# Patient Record
Sex: Male | Born: 1970 | Race: Black or African American | Hispanic: No | Marital: Single | Smoking: Current every day smoker
Health system: Southern US, Community
[De-identification: ages and names within clinical notes are randomized; demographics above are authoritative.]

---

## 2011-04-19 ENCOUNTER — Emergency Department (HOSPITAL_COMMUNITY)
Admission: EM | Admit: 2011-04-19 | Discharge: 2011-04-19 | Disposition: A | Discharge status: Home / Self Care | Payer: Self-pay | Attending: Emergency Medicine | Admitting: Emergency Medicine

## 2011-04-19 ENCOUNTER — Encounter (HOSPITAL_COMMUNITY): Payer: Self-pay | Admitting: Emergency Medicine

## 2011-04-19 DIAGNOSIS — F17200 Nicotine dependence, unspecified, uncomplicated: Secondary | ICD-10-CM | POA: Insufficient documentation

## 2011-04-19 DIAGNOSIS — Z203 Contact with and (suspected) exposure to rabies: Secondary | ICD-10-CM | POA: Insufficient documentation

## 2011-04-19 DIAGNOSIS — S61409A Unspecified open wound of unspecified hand, initial encounter: Secondary | ICD-10-CM | POA: Insufficient documentation

## 2011-04-19 DIAGNOSIS — Y92410 Unspecified street and highway as the place of occurrence of the external cause: Secondary | ICD-10-CM | POA: Insufficient documentation

## 2011-04-19 DIAGNOSIS — W540XXA Bitten by dog, initial encounter: Secondary | ICD-10-CM

## 2011-04-19 DIAGNOSIS — Z23 Encounter for immunization: Secondary | ICD-10-CM

## 2011-04-19 MED ORDER — TETANUS-DIPHTH-ACELL PERTUSSIS 5-2.5-18.5 LF-MCG/0.5 IM SUSP
0.5000 mL | Freq: Once | INTRAMUSCULAR | Status: AC
  Administered 2011-04-19: 0.5 mL via INTRAMUSCULAR
  Filled 2011-04-19: qty 0.5

## 2011-04-19 MED ORDER — OXYCODONE-ACETAMINOPHEN 5-325 MG PO TABS
2.0000 | ORAL_TABLET | Freq: Once | ORAL | Status: AC
  Administered 2011-04-19: 2 via ORAL
  Filled 2011-04-19: qty 2

## 2011-04-19 MED ORDER — OXYCODONE-ACETAMINOPHEN 5-325 MG PO TABS: 2.0000 | ORAL_TABLET | Freq: Four times a day (QID) | ORAL | Status: AC | PRN

## 2011-04-19 MED ORDER — AMOXICILLIN-POT CLAVULANATE 875-125 MG PO TABS: 1.0000 | ORAL_TABLET | ORAL | Status: DC

## 2011-04-19 MED ORDER — AMOXICILLIN-POT CLAVULANATE 875-125 MG PO TABS
1.0000 | ORAL_TABLET | ORAL | Status: AC
  Administered 2011-04-19: 1 via ORAL
  Filled 2011-04-19: qty 1

## 2011-04-19 MED ORDER — RABIES IMMUNE GLOBULIN 150 UNIT/ML IM INJ
20.0000 [IU]/kg | INJECTION | INTRAMUSCULAR | Status: AC
  Administered 2011-04-19: 1650 [IU]
  Filled 2011-04-19: qty 11

## 2011-04-19 MED ORDER — RABIES VACCINE, PCEC IM SUSR
1.0000 mL | INTRAMUSCULAR | Status: AC
  Administered 2011-04-19: 1 mL via INTRAMUSCULAR
  Filled 2011-04-19: qty 1

## 2011-04-19 NOTE — ED Notes (Signed)
Pt c/o animal bite of R hand. Pt has 3 puncture wounds around his thumb. Pt states it was a baby pitbull. Encounter with dog occurred around 4pm today. Pt pain 8/10.

## 2011-04-19 NOTE — ED Notes (Signed)
Pt states he was walking down road on [facility] and a pit bull came up and bit his R hand.  3 puncture wounds noted to R hand. Bleeding controlled.  Unknown dog.

## 2011-04-19 NOTE — ED Notes (Signed)
Called flow Production designer, theatre/television[employer]film. Spoke with Patty concerning f/u appts. She states to inform pt that they will contact him within the next couple days. Pt informed.

## 2011-04-20 NOTE — ED Provider Notes (Signed)
History     CSN: [id number]  Arrival date & time 04/19/11  1652   First MD Initiated Contact with Patient 04/19/11 1855      Chief Complaint  Patient presents with  . Animal Bite    (Consider location/radiation/quality/duration/timing/severity/associated sxs/prior treatment) HPI Patient is a 41 yo male who was walking down the street today when an unknown dog ran up and bit his right hand.  He sustained 3 puncture wounds.  Patient does not know when his last tetanus shot was.  He denies significant pain at this time.  The dog was unknown and unable to be found.  There are no other associated or modifying factors.  History reviewed. No pertinent past medical history.  History reviewed. No pertinent past surgical history.  History reviewed. No pertinent family history.  History  Substance Use Topics  . Smoking status: Current Everyday Smoker  . Smokeless tobacco: Not on file  . Alcohol Use: Yes      Review of Systems  All other systems reviewed and are negative.    Allergies  Review of patient's allergies indicates no known allergies.  Home Medications   Current Outpatient Rx  Name Route Sig Dispense Refill  . AMOXICILLIN-POT CLAVULANATE 875-125 MG PO TABS Oral Take 1 tablet by mouth to [facility]. 20 tablet 0  . OXYCODONE-ACETAMINOPHEN 5-325 MG PO TABS Oral Take 2 tablets by mouth every 6 (six) hours as needed for pain. 10 tablet 0    BP 132/82  Pulse 92  Temp(Src) 98.1 F (36.7 C) (Oral)  Resp 20  Ht 6\' 2"  (1.88 m)  Wt 181 lb 11.2 oz (82.419 kg)  BMI 23.33 kg/m2  SpO2 97%  Physical Exam  Nursing note and vitals reviewed. GEN: Well-developed, well-nourished male in no distress HEENT: Atraumatic, normocephalic. Oropharynx clear without erythema EYES: PERRLA BL, no scleral icterus. NECK: Trachea midline, no meningismus Neuro: cranial nerves 2-12 intact, no abnormalities of strength or sensation, A and O x 3 MSK: Patient moves all 4 extremities  symmetrically, no deformity, edema, or injury noted Psych: no abnormality of mood Skin: Patient has 3 small puncture wounds over his right thumb and thenar eminence   ED Course  Procedures (including critical care time)  Labs Reviewed - No data to display No results found.   1. Dog bite   2. Need for prophylactic vaccination and inoculation against rabies       MDM  Patient was evaluated and was given a TDAP as well as rabies vaccine and immunoglobulin.  He was also given a dose of augmentin and was discharged with prescription for this.  Patient did have a percocet for pain from the vaccination and treatment process.  He was discharged with 10 pills of this.  Patient was told flow manager will be contacting him by phone for further vaccinations.        Cyndra Numbers, MD 04/20/11 1451

## 2011-04-20 NOTE — ED Notes (Signed)
Patient informed to call Urgent Care For Follow up appoinmentt time.Copy of schedule faxed to urgent care and pharmacy.

## 2011-04-22 ENCOUNTER — Telehealth (HOSPITAL_COMMUNITY): Payer: Self-pay | Admitting: *Deleted

## 2011-04-22 ENCOUNTER — Emergency Department (INDEPENDENT_AMBULATORY_CARE_PROVIDER_SITE_OTHER)
Admission: EM | Admit: 2011-04-22 | Discharge: 2011-04-22 | Disposition: A | Discharge status: Home / Self Care | Payer: Self-pay | Source: Home / Self Care

## 2011-04-22 ENCOUNTER — Encounter (HOSPITAL_COMMUNITY): Payer: Self-pay | Admitting: *Deleted

## 2011-04-22 DIAGNOSIS — Z23 Encounter for immunization: Secondary | ICD-10-CM

## 2011-04-22 MED ORDER — RABIES VACCINE, PCEC IM SUSR
INTRAMUSCULAR | Status: AC
  Filled 2011-04-22: qty 1

## 2011-04-22 MED ORDER — RABIES VACCINE, PCEC IM SUSR
1.0000 mL | Freq: Once | INTRAMUSCULAR | Status: AC
  Administered 2011-04-22: 1 mL via INTRAMUSCULAR

## 2011-04-22 NOTE — ED Notes (Signed)
Pt. Here for 2nd rabies vaccine for dog bite ( pitbull) to R thumb.  Pt. states he has not filled the Rx. of Augmentin because it was $50.00 and he could not afford. He filled the Percocet.

## 2011-04-22 NOTE — Discharge Instructions (Signed)
Call if any problems.  Return 2/17 for next vaccine.

## 2011-04-22 NOTE — ED Notes (Addendum)
Pt. instructed to get Augmentin ASAP and start taking it.  Pt. told that if the joint gets infected, he may have to be admitted to the hospital for IV antibiotics or need surgery. Pt. instructed to wash wounds BID with soap and water and apply antibiotic ointment. Pt. voiced understanding.

## 2011-04-22 NOTE — ED Notes (Signed)
Pt. called to schedule his rabies shot. States he did not get a schedule. He said he was seen 2/10. I told him he needs to come today and 2/17 and 2/24. Because the last 2 days fall on the weekend, I told pt. He can come anytime between 0900 and 1900. He needs to come today before 1700. I called the pharmacy and she said she did not receive information on the pt. She transferred to Von, I told him we would need 3 rabies vaccines for the pt. Tonya flow manager documented that she sent information to [place] and the pharmacy on 2/11. I checked the Rabies book and the fax machine and no information received. Vassie Moselle 04/22/2011

## 2011-04-23 ENCOUNTER — Telehealth (HOSPITAL_COMMUNITY): Payer: Self-pay | Admitting: *Deleted

## 2011-04-26 ENCOUNTER — Emergency Department (HOSPITAL_COMMUNITY)
Admission: EM | Admit: 2011-04-26 | Discharge: 2011-04-26 | Disposition: A | Discharge status: Home / Self Care | Payer: Self-pay | Source: Home / Self Care

## 2011-04-26 ENCOUNTER — Encounter (HOSPITAL_COMMUNITY): Payer: Self-pay | Admitting: *Deleted

## 2011-04-26 MED ORDER — RABIES VACCINE, PCEC IM SUSR
INTRAMUSCULAR | Status: AC
  Filled 2011-04-26: qty 1

## 2011-04-26 MED ORDER — RABIES VACCINE, PCEC IM SUSR
1.0000 mL | Freq: Once | INTRAMUSCULAR | Status: AC
  Administered 2011-04-26: 1 mL via INTRAMUSCULAR

## 2011-04-26 NOTE — Discharge Instructions (Signed)
Return on 2./24 for final rabies vaccination

## 2011-04-26 NOTE — ED Notes (Signed)
Pt here for 3rd rabies injection 

## 2011-04-26 NOTE — ED Notes (Signed)
Right hand bite site healing well no signs of infection   Pt did not get antibiotic or pain med prescription filled due to cost

## 2011-05-03 ENCOUNTER — Encounter (HOSPITAL_COMMUNITY): Payer: Self-pay | Admitting: *Deleted

## 2011-05-03 ENCOUNTER — Emergency Department (HOSPITAL_COMMUNITY)
Admission: EM | Admit: 2011-05-03 | Discharge: 2011-05-03 | Disposition: A | Discharge status: Home / Self Care | Payer: Self-pay | Source: Home / Self Care

## 2011-05-03 MED ORDER — RABIES VACCINE, PCEC IM SUSR
1.0000 mL | Freq: Once | INTRAMUSCULAR | Status: AC
  Administered 2011-05-03: 1 mL via INTRAMUSCULAR

## 2011-05-03 MED ORDER — RABIES VACCINE, PCEC IM SUSR
INTRAMUSCULAR | Status: AC
  Filled 2011-05-03: qty 1

## 2011-05-03 NOTE — ED Notes (Signed)
Patient here for final rabies injection. 

## 2011-10-07 ENCOUNTER — Emergency Department (HOSPITAL_COMMUNITY)
Admission: EM | Admit: 2011-10-07 | Discharge: 2011-10-08 | Disposition: A | Discharge status: Home / Self Care | Payer: No Typology Code available for payment source | Attending: Emergency Medicine | Admitting: Emergency Medicine

## 2011-10-07 ENCOUNTER — Encounter (HOSPITAL_COMMUNITY): Payer: Self-pay | Admitting: Emergency Medicine

## 2011-10-07 ENCOUNTER — Emergency Department (HOSPITAL_COMMUNITY): Payer: No Typology Code available for payment source

## 2011-10-07 DIAGNOSIS — S335XXA Sprain of ligaments of lumbar spine, initial encounter: Secondary | ICD-10-CM | POA: Insufficient documentation

## 2011-10-07 DIAGNOSIS — Y998 Other external cause status: Secondary | ICD-10-CM | POA: Insufficient documentation

## 2011-10-07 DIAGNOSIS — T148XXA Other injury of unspecified body region, initial encounter: Secondary | ICD-10-CM

## 2011-10-07 DIAGNOSIS — Y93I9 Activity, other involving external motion: Secondary | ICD-10-CM | POA: Insufficient documentation

## 2011-10-07 DIAGNOSIS — F17200 Nicotine dependence, unspecified, uncomplicated: Secondary | ICD-10-CM | POA: Insufficient documentation
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
## 2011-10-08 MED ORDER — TRAMADOL HCL 50 MG PO TABS: 50.0000 mg | ORAL_TABLET | Freq: Four times a day (QID) | ORAL | Status: AC | PRN

## 2011-10-08 NOTE — ED Notes (Signed)
Pt states understanding of discharge instructions 

## 2011-10-08 NOTE — ED Provider Notes (Signed)
Medical screening examination/treatment/procedure(s) were performed by non-physician practitioner and as supervising physician I was immediately available for consultation/collaboration.    Vida Roller, MD 10/08/11 [phone]

## 2014-04-18 ENCOUNTER — Other Ambulatory Visit: Payer: Self-pay | Admitting: *Deleted

## 2017-01-07 ENCOUNTER — Emergency Department (HOSPITAL_COMMUNITY)
Admission: EM | Admit: 2017-01-07 | Discharge: 2017-01-07 | Disposition: A | Discharge status: Home / Self Care | Payer: Self-pay | Attending: Emergency Medicine | Admitting: Emergency Medicine

## 2017-01-07 ENCOUNTER — Emergency Department (HOSPITAL_COMMUNITY): Payer: Self-pay

## 2017-01-07 ENCOUNTER — Encounter (HOSPITAL_COMMUNITY): Payer: Self-pay

## 2017-01-07 DIAGNOSIS — B349 Viral infection, unspecified: Secondary | ICD-10-CM | POA: Insufficient documentation

## 2017-01-07 DIAGNOSIS — J069 Acute upper respiratory infection, unspecified: Secondary | ICD-10-CM | POA: Insufficient documentation

## 2017-01-07 DIAGNOSIS — M545 Low back pain, unspecified: Secondary | ICD-10-CM

## 2017-01-07 DIAGNOSIS — F17210 Nicotine dependence, cigarettes, uncomplicated: Secondary | ICD-10-CM | POA: Insufficient documentation

## 2017-01-07 MED ORDER — ALBUTEROL SULFATE HFA 108 (90 BASE) MCG/ACT IN AERS
2.0000 | INHALATION_SPRAY | RESPIRATORY_TRACT | Status: DC | PRN
  Administered 2017-01-07: 2 via RESPIRATORY_TRACT
  Filled 2017-01-07: qty 6.7

## 2017-01-07 MED ORDER — TRAMADOL HCL 50 MG PO TABS: 50.0000 mg | ORAL_TABLET | Freq: Four times a day (QID) | ORAL | 0 refills | Status: DC | PRN

## 2017-01-07 MED ORDER — PREDNISONE 20 MG PO TABS: ORAL_TABLET | ORAL | 0 refills | Status: DC

## 2017-01-07 MED ORDER — ALBUTEROL SULFATE (2.5 MG/3ML) 0.083% IN NEBU
5.0000 mg | INHALATION_SOLUTION | Freq: Once | RESPIRATORY_TRACT | Status: AC
  Administered 2017-01-07: 5 mg via RESPIRATORY_TRACT
  Filled 2017-01-07: qty 6

## 2017-01-07 NOTE — ED Provider Notes (Signed)
[facility] Provider Note   CSN: [id number] Arrival date & time: 01/07/17  1958     History   Chief Complaint Chief Complaint  Patient presents with  . Cough  . Nasal Congestion    HPI [name] is a 46 y.o. male.  Patient is a 46 year old male with no significant past medical history who presents with cough and congestion as well as back pain.  He reports a one-week history of runny nose nasal congestion and cough.  He is also had some shortness of breath on exertion but denies any current shortness of breath.  No chest pain.  He states with the coughing he feels like he has injured his back.  He complains of a 2-day history of pain in his low back.  It goes across his back.  He denies any falls or trauma.  No radiation down his legs.  No fevers.  No weakness in his legs.  No numbness to his lower extremities.  It is worse when he is ambulating.  No loss of bowel or bladder control.  He has been using over-the-counter cold medicines without improvement in his symptoms.  He is not taking any medicines for his back.  He denies any prior history of back issues.      History reviewed. No pertinent past medical history.  There are no active problems to display for this patient.   History reviewed. No pertinent surgical history.     Home Medications    Prior to Admission medications   Medication Sig Start Date End Date Taking? Authorizing Provider  aspirin-sod bicarb-citric acid (ALKA-SELTZER) 325 MG TBEF tablet Take 325 mg by mouth every 6 (six) hours as needed (congestion).   Yes [provider]  dextromethorphan-guaiFENesin (MUCINEX DM) 30-600 MG 12hr tablet Take 1 tablet by mouth 2 (two) times daily as needed for cough.   Yes [provider]  predniSONE (DELTASONE) 20 MG tablet 3 tabs po day one, then 2 po daily x 4 days 01/07/17   Rolan Bucco, MD  traMADol (ULTRAM) 50 MG tablet Take 1 tablet (50 mg total) by mouth  every 6 (six) hours as needed. 01/07/17   Rolan Bucco, MD    Family History History reviewed. No pertinent family history.  Social History Social History  Substance Use Topics  . Smoking status: Current Every Day Smoker    Packs/day: 0.25    Types: Cigarettes  . Smokeless tobacco: Not on file  . Alcohol use 0.0 oz/week     Comment: 2 beers daily     Allergies   Iodine   Review of Systems Review of Systems  Constitutional: Positive for fatigue. Negative for chills, diaphoresis and fever.  HENT: Positive for congestion, postnasal drip and rhinorrhea. Negative for sneezing.   Eyes: Negative.   Respiratory: Positive for cough and shortness of breath. Negative for chest tightness.   Cardiovascular: Negative for chest pain and leg swelling.  Gastrointestinal: Negative for abdominal pain, blood in stool, diarrhea, nausea and vomiting.  Genitourinary: Negative for difficulty urinating, flank pain, frequency and hematuria.  Musculoskeletal: Positive for back pain. Negative for arthralgias.  Skin: Negative for rash.  Neurological: Negative for dizziness, speech difficulty, weakness, numbness and headaches.     Physical Exam Updated Vital Signs BP 117/74 (BP Location: Left Arm)   Pulse 85   Temp 98.8 F (37.1 C) (Oral)   Resp 18   Ht 6\' 3"  (1.905 m)   Wt 81.6 kg (180 lb)  SpO2 98%   BMI 22.50 kg/m   Physical Exam  Constitutional: He is oriented to person, place, and time. He appears well-developed and well-nourished.  HENT:  Head: Normocephalic and atraumatic.  Eyes: Pupils are equal, round, and reactive to light.  Neck: Normal range of motion. Neck supple.  Cardiovascular: Normal rate, regular rhythm and normal heart sounds.   Pulmonary/Chest: Effort normal. No respiratory distress. He has wheezes (Mild expiratory wheezing). He has no rales. He exhibits no tenderness.  No increased work of breathing, he is talking in full sentences  Abdominal: Soft. Bowel sounds  are normal. There is no tenderness. There is no rebound and no guarding.  Musculoskeletal: Normal range of motion. He exhibits no edema.  Positive tenderness along the musculature of the lumbar region.  No spinal tenderness.  Negative straight leg raise bilaterally.  Patellar reflex is symmetric bilaterally.  He has normal motor function and sensation to the lower extremities bilaterally.  Pedal pulses are intact bilaterally.  Lymphadenopathy:    He has no cervical adenopathy.  Neurological: He is alert and oriented to person, place, and time.  Skin: Skin is warm and dry. No rash noted.  Psychiatric: He has a normal mood and affect.     ED Treatments / Results  Labs (all labs ordered are listed, but only abnormal results are displayed) Labs Reviewed - No data to display  EKG  EKG Interpretation None       Radiology Dg Chest 2 View  Result Date: 01/07/2017 CLINICAL DATA:  Per order- SOB Pt reports experiencing productive cough congestion, dyspnea with exertion and some weakness today. Pt has no other chest complaints at this time. PT HX: current smoker EXAM: CHEST  2 VIEW COMPARISON:  10/07/2011 FINDINGS: Heart size is normal. There is mild perihilar peribronchial thickening. There are no focal consolidations or pleural effusions. No pulmonary edema. IMPRESSION: Mild bronchitic changes.  No focal acute pulmonary abnormality. Electronically Signed   By: Norva Pavlov M.D.   On: 01/07/2017 21:53    Procedures Procedures (including critical care time)  Medications Ordered in ED Medications  albuterol (PROVENTIL) (2.5 MG/3ML) 0.083% nebulizer solution 5 mg (5 mg Nebulization Given 01/07/17 2107)     Initial Impression / Assessment and Plan / ED Course  I have reviewed the triage vital signs and the nursing notes.  Pertinent labs & imaging results that were available during my care of the patient were reviewed by me and considered in my medical decision making (see chart for  details).     Patient presents with cough and cold symptoms.  There is no evidence of pneumonia.  He was given a nebulizer treatment here and currently denies any shortness of breath.  He has no hypoxia.  He was discharged home in good condition.  He was given a prescription for prednisone and dispensed an albuterol inhaler.  He has lower back pain which is likely triggered by the coughing.  There is no radicular symptoms or signs of cauda equina.  No recent trauma.  He was discharged home in good condition.  He was given a resource guide for possible outpatient follow-up.  He was given a prescription for tramadol for pain.  Return precautions were given.  Final Clinical Impressions(s) / ED Diagnoses   Final diagnoses:  Viral upper respiratory tract infection  Acute bilateral low back pain without sciatica    New Prescriptions New Prescriptions   PREDNISONE (DELTASONE) 20 MG TABLET    3 tabs po day one,  then 2 po daily x 4 days   TRAMADOL (ULTRAM) 50 MG TABLET    Take 1 tablet (50 mg total) by mouth every 6 (six) hours as needed.     Rolan BuccoBelfi, Sagan Maselli, MD 01/07/17 [phone]

## 2017-01-07 NOTE — ED Notes (Signed)
Pt verbalized understanding of d/c instructions, follow up care, and prescriptions. A/Ox4, ambulatiory. All belongings with patient upon departure. 

## 2017-01-07 NOTE — ED Triage Notes (Addendum)
Pt reports experiencing cough (pt states normally not productive), congestion, dyspnea with exertion and some weakness. Denies chest pain. Pt has taken alka seltzer, mucinex with only temporary relief. Pt states he normally rides his bike daily, but today was unable to because "he just felt so bad. "Current every day smoker. States he had hx of bronchitis or wheezing as a child when it got cold. Expiratory wheezes noted on assessment.

## 2017-07-18 ENCOUNTER — Ambulatory Visit (HOSPITAL_COMMUNITY)
Admission: EM | Admit: 2017-07-18 | Discharge: 2017-07-18 | Disposition: A | Discharge status: Home / Self Care | Payer: Self-pay | Attending: Emergency Medicine | Admitting: Emergency Medicine

## 2017-07-18 ENCOUNTER — Encounter (HOSPITAL_COMMUNITY): Payer: Self-pay | Admitting: Emergency Medicine

## 2017-07-18 ENCOUNTER — Emergency Department (HOSPITAL_COMMUNITY): Payer: Self-pay | Admitting: Anesthesiology

## 2017-07-18 ENCOUNTER — Encounter (HOSPITAL_COMMUNITY)
Admission: EM | Disposition: A | Discharge status: Home / Self Care | Payer: Self-pay | Source: Home / Self Care | Attending: Emergency Medicine

## 2017-07-18 DIAGNOSIS — S66922A Laceration of unspecified muscle, fascia and tendon at wrist and hand level, left hand, initial encounter: Secondary | ICD-10-CM

## 2017-07-18 DIAGNOSIS — S65012A Laceration of ulnar artery at wrist and hand level of left arm, initial encounter: Secondary | ICD-10-CM | POA: Insufficient documentation

## 2017-07-18 DIAGNOSIS — S6402XA Injury of ulnar nerve at wrist and hand level of left arm, initial encounter: Secondary | ICD-10-CM | POA: Insufficient documentation

## 2017-07-18 DIAGNOSIS — S56128A Laceration of flexor muscle, fascia and tendon of left little finger at forearm level, initial encounter: Secondary | ICD-10-CM | POA: Insufficient documentation

## 2017-07-18 DIAGNOSIS — W19XXXA Unspecified fall, initial encounter: Secondary | ICD-10-CM | POA: Insufficient documentation

## 2017-07-18 DIAGNOSIS — F17210 Nicotine dependence, cigarettes, uncomplicated: Secondary | ICD-10-CM | POA: Insufficient documentation

## 2017-07-18 DIAGNOSIS — S56222A Laceration of other flexor muscle, fascia and tendon at forearm level, left arm, initial encounter: Secondary | ICD-10-CM | POA: Insufficient documentation

## 2017-07-18 HISTORY — PX: NERVE, TENDON AND ARTERY REPAIR: SHX5695

## 2017-07-18 LAB — BASIC METABOLIC PANEL
Anion gap: 12 (ref 5–15)
BUN: 17 mg/dL (ref 6–20)
CALCIUM: 8.7 mg/dL — AB (ref 8.9–10.3)
CO2: 22 mmol/L (ref 22–32)
Chloride: 105 mmol/L (ref 101–111)
Creatinine, Ser: 0.98 mg/dL (ref 0.61–1.24)
GFR calc Af Amer: >60 mL/min (ref >60)
Glucose, Bld: 83 mg/dL (ref 65–99)
Potassium: 4.1 mmol/L (ref 3.5–5.1)
Sodium: 139 mmol/L (ref 135–145)

## 2017-07-18 LAB — CBC WITH DIFFERENTIAL/PLATELET
Basophils Absolute: 0.1 10*3/uL (ref 0.0–0.1)
Basophils Relative: 1 %
EOS PCT: 5 %
Eosinophils Absolute: 0.4 10*3/uL (ref 0.0–0.7)
HCT: 34.1 % — ABNORMAL LOW (ref 39.0–52.0)
Hemoglobin: 11.4 g/dL — ABNORMAL LOW (ref 13.0–17.0)
LYMPHS ABS: 1.7 10*3/uL (ref 0.7–4.0)
LYMPHS PCT: 25 %
MCH: 29.9 pg (ref 26.0–34.0)
MCHC: 33.4 g/dL (ref 30.0–36.0)
MCV: 89.5 fL (ref 78.0–100.0)
MONO ABS: 0.5 10*3/uL (ref 0.1–1.0)
Monocytes Relative: 8 %
Neutro Abs: 4 10*3/uL (ref 1.7–7.7)
Neutrophils Relative %: 61 %
PLATELETS: 260 10*3/uL (ref 150–400)
RBC: 3.81 MIL/uL — ABNORMAL LOW (ref 4.22–5.81)
RDW: 15.1 % (ref 11.5–15.5)
WBC: 6.6 10*3/uL (ref 4.0–10.5)

## 2017-07-18 SURGERY — NERVE, TENDON AND ARTERY REPAIR
Anesthesia: General | Site: Wrist | Laterality: Left

## 2017-07-18 MED ORDER — FENTANYL CITRATE (PF) 250 MCG/5ML IJ SOLN
INTRAMUSCULAR | Status: DC | PRN
  Administered 2017-07-18 (×2): 50 ug via INTRAVENOUS
  Administered 2017-07-18: 100 ug via INTRAVENOUS

## 2017-07-18 MED ORDER — FENTANYL CITRATE (PF) 100 MCG/2ML IJ SOLN: 25.0000 ug | INTRAMUSCULAR | Status: DC | PRN

## 2017-07-18 MED ORDER — ASPIRIN EC 325 MG PO TBEC: 325.0000 mg | DELAYED_RELEASE_TABLET | Freq: Every day | ORAL | 0 refills | Status: AC

## 2017-07-18 MED ORDER — OXYCODONE HCL 5 MG PO TABS
ORAL_TABLET | ORAL | Status: AC
  Filled 2017-07-18: qty 1

## 2017-07-18 MED ORDER — HYDROCODONE-ACETAMINOPHEN 5-325 MG PO TABS: ORAL_TABLET | ORAL | 0 refills | Status: AC

## 2017-07-18 MED ORDER — KETOROLAC TROMETHAMINE 30 MG/ML IJ SOLN
INTRAMUSCULAR | Status: DC | PRN
  Administered 2017-07-18: 30 mg via INTRAVENOUS

## 2017-07-18 MED ORDER — ROCURONIUM BROMIDE 100 MG/10ML IV SOLN
INTRAVENOUS | Status: DC | PRN
  Administered 2017-07-18: 50 mg via INTRAVENOUS
  Administered 2017-07-18 (×3): 20 mg via INTRAVENOUS
  Administered 2017-07-18: 10 mg via INTRAVENOUS

## 2017-07-18 MED ORDER — MIDAZOLAM HCL 2 MG/2ML IJ SOLN
INTRAMUSCULAR | Status: AC
  Filled 2017-07-18: qty 2

## 2017-07-18 MED ORDER — 0.9 % SODIUM CHLORIDE (POUR BTL) OPTIME
TOPICAL | Status: DC | PRN
  Administered 2017-07-18: 1000 mL

## 2017-07-18 MED ORDER — PROPOFOL 10 MG/ML IV BOLUS
INTRAVENOUS | Status: DC | PRN
  Administered 2017-07-18: 200 mg via INTRAVENOUS

## 2017-07-18 MED ORDER — BUPIVACAINE HCL (PF) 0.25 % IJ SOLN
INTRAMUSCULAR | Status: DC | PRN
  Administered 2017-07-18: 8 mL

## 2017-07-18 MED ORDER — ONDANSETRON HCL 4 MG/2ML IJ SOLN
INTRAMUSCULAR | Status: DC | PRN
  Administered 2017-07-18: 4 mg via INTRAVENOUS

## 2017-07-18 MED ORDER — SUCCINYLCHOLINE CHLORIDE 20 MG/ML IJ SOLN
INTRAMUSCULAR | Status: DC | PRN
  Administered 2017-07-18: 100 mg via INTRAVENOUS

## 2017-07-18 MED ORDER — DEXAMETHASONE SODIUM PHOSPHATE 10 MG/ML IJ SOLN
INTRAMUSCULAR | Status: AC
  Filled 2017-07-18: qty 1

## 2017-07-18 MED ORDER — OXYCODONE HCL 5 MG PO TABS
5.0000 mg | ORAL_TABLET | ORAL | Status: AC | PRN
  Administered 2017-07-18: 5 mg via ORAL

## 2017-07-18 MED ORDER — PHENYLEPHRINE 40 MCG/ML (10ML) SYRINGE FOR IV PUSH (FOR BLOOD PRESSURE SUPPORT)
PREFILLED_SYRINGE | INTRAVENOUS | Status: DC | PRN
  Administered 2017-07-18 (×5): 80 ug via INTRAVENOUS

## 2017-07-18 MED ORDER — ROCURONIUM BROMIDE 50 MG/5ML IV SOLN
INTRAVENOUS | Status: AC
  Filled 2017-07-18: qty 1

## 2017-07-18 MED ORDER — ROCURONIUM BROMIDE 50 MG/5ML IV SOLN
INTRAVENOUS | Status: AC
  Filled 2017-07-18: qty 2

## 2017-07-18 MED ORDER — LIDOCAINE 2% (20 MG/ML) 5 ML SYRINGE
INTRAMUSCULAR | Status: DC | PRN
  Administered 2017-07-18: 100 mg via INTRAVENOUS

## 2017-07-18 MED ORDER — KETOROLAC TROMETHAMINE 30 MG/ML IJ SOLN
INTRAMUSCULAR | Status: AC
  Filled 2017-07-18: qty 1

## 2017-07-18 MED ORDER — PROMETHAZINE HCL 25 MG/ML IJ SOLN: 6.2500 mg | INTRAMUSCULAR | Status: DC | PRN

## 2017-07-18 MED ORDER — PROPOFOL 10 MG/ML IV BOLUS
INTRAVENOUS | Status: AC
  Filled 2017-07-18: qty 20

## 2017-07-18 MED ORDER — LIDOCAINE 2% (20 MG/ML) 5 ML SYRINGE
INTRAMUSCULAR | Status: AC
  Filled 2017-07-18: qty 10

## 2017-07-18 MED ORDER — SULFAMETHOXAZOLE-TRIMETHOPRIM 800-160 MG PO TABS: 1.0000 | ORAL_TABLET | Freq: Two times a day (BID) | ORAL | 0 refills | Status: AC

## 2017-07-18 MED ORDER — CEFAZOLIN SODIUM-DEXTROSE 2-3 GM-%(50ML) IV SOLR
INTRAVENOUS | Status: DC | PRN
  Administered 2017-07-18: 2 g via INTRAVENOUS

## 2017-07-18 MED ORDER — LACTATED RINGERS IV SOLN
INTRAVENOUS | Status: DC | PRN
  Administered 2017-07-18 (×2): via INTRAVENOUS

## 2017-07-18 MED ORDER — BUPIVACAINE HCL (PF) 0.25 % IJ SOLN
INTRAMUSCULAR | Status: AC
  Filled 2017-07-18: qty 30

## 2017-07-18 MED ORDER — SUCCINYLCHOLINE CHLORIDE 200 MG/10ML IV SOSY
PREFILLED_SYRINGE | INTRAVENOUS | Status: AC
  Filled 2017-07-18: qty 20

## 2017-07-18 MED ORDER — LIDOCAINE-EPINEPHRINE (PF) 2 %-1:200000 IJ SOLN
20.0000 mL | Freq: Once | INTRAMUSCULAR | Status: AC
  Administered 2017-07-18: 20 mL
  Filled 2017-07-18: qty 20

## 2017-07-18 MED ORDER — SUGAMMADEX SODIUM 200 MG/2ML IV SOLN
INTRAVENOUS | Status: DC | PRN
  Administered 2017-07-18: 160 mg via INTRAVENOUS

## 2017-07-18 MED ORDER — FENTANYL CITRATE (PF) 250 MCG/5ML IJ SOLN
INTRAMUSCULAR | Status: AC
Start: 2017-07-18 — End: ?
  Filled 2017-07-18: qty 5

## 2017-07-18 MED ORDER — SUGAMMADEX SODIUM 200 MG/2ML IV SOLN
INTRAVENOUS | Status: AC
  Filled 2017-07-18: qty 2

## 2017-07-18 MED ORDER — ONDANSETRON HCL 4 MG/2ML IJ SOLN
INTRAMUSCULAR | Status: AC
  Filled 2017-07-18: qty 4

## 2017-07-18 MED ORDER — DEXAMETHASONE SODIUM PHOSPHATE 10 MG/ML IJ SOLN
INTRAMUSCULAR | Status: DC | PRN
  Administered 2017-07-18: 10 mg via INTRAVENOUS

## 2017-07-18 SURGICAL SUPPLY — 64 items
BAG DECANTER FOR FLEXI CONT (MISCELLANEOUS) IMPLANT
BANDAGE ACE 3X5.8 VEL STRL LF (GAUZE/BANDAGES/DRESSINGS) ×3 IMPLANT
BLADE MINI RND TIP GREEN BEAV (BLADE) IMPLANT
BLADE SURG 15 STRL LF DISP TIS (BLADE) ×2 IMPLANT
BLADE SURG 15 STRL SS (BLADE) ×4
BNDG ESMARK 4X9 LF (GAUZE/BANDAGES/DRESSINGS) ×3 IMPLANT
BNDG GAUZE ELAST 4 BULKY (GAUZE/BANDAGES/DRESSINGS) ×3 IMPLANT
CANISTER SUCT 3000ML (MISCELLANEOUS) ×3 IMPLANT
CHLORAPREP W/TINT 26ML (MISCELLANEOUS) ×3 IMPLANT
CORDS BIPOLAR (ELECTRODE) ×3 IMPLANT
COVER MAYO STAND STRL (DRAPES) ×3 IMPLANT
COVER SURGICAL LIGHT HANDLE (MISCELLANEOUS) ×3 IMPLANT
CUFF TOURNIQUET SINGLE 18IN (TOURNIQUET CUFF) ×3 IMPLANT
DECANTER SPIKE VIAL GLASS SM (MISCELLANEOUS) ×3 IMPLANT
DRAPE SURG 17X23 STRL (DRAPES) ×3 IMPLANT
GAUZE SPONGE 4X4 12PLY STRL (GAUZE/BANDAGES/DRESSINGS) ×3 IMPLANT
GAUZE XEROFORM 1X8 LF (GAUZE/BANDAGES/DRESSINGS) ×3 IMPLANT
GLOVE BIO SURGEON STRL SZ7.5 (GLOVE) ×6 IMPLANT
GLOVE BIOGEL PI IND STRL 8 (GLOVE) ×2 IMPLANT
GLOVE BIOGEL PI INDICATOR 8 (GLOVE) ×4
GOWN STRL REUS W/ TWL LRG LVL3 (GOWN DISPOSABLE) ×1 IMPLANT
GOWN STRL REUS W/ TWL XL LVL3 (GOWN DISPOSABLE) ×1 IMPLANT
GOWN STRL REUS W/TWL LRG LVL3 (GOWN DISPOSABLE) ×2
GOWN STRL REUS W/TWL XL LVL3 (GOWN DISPOSABLE) ×2
KIT BASIN OR (CUSTOM PROCEDURE TRAY) ×3 IMPLANT
LOOP VESSEL MAXI BLUE (MISCELLANEOUS) IMPLANT
NDL SAFETY ECLIPSE 18X1.5 (NEEDLE) IMPLANT
NEEDLE HYPO 18GX1.5 SHARP (NEEDLE)
NEEDLE HYPO 25X1 1.5 SAFETY (NEEDLE) IMPLANT
NS IRRIG 1000ML POUR BTL (IV SOLUTION) ×3 IMPLANT
PACK ORTHO EXTREMITY (CUSTOM PROCEDURE TRAY) ×3 IMPLANT
PAD CAST 3X4 CTTN HI CHSV (CAST SUPPLIES) IMPLANT
PAD CAST 4YDX4 CTTN HI CHSV (CAST SUPPLIES) ×1 IMPLANT
PADDING CAST ABS 4INX4YD NS (CAST SUPPLIES) ×2
PADDING CAST ABS COTTON 4X4 ST (CAST SUPPLIES) ×1 IMPLANT
PADDING CAST COTTON 3X4 STRL (CAST SUPPLIES)
PADDING CAST COTTON 4X4 STRL (CAST SUPPLIES) ×2
SPEAR EYE SURG WECK-CEL (MISCELLANEOUS) IMPLANT
SPLINT PLASTER CAST XFAST 3X15 (CAST SUPPLIES) IMPLANT
SPLINT PLASTER EXTRA FAST 3X15 (CAST SUPPLIES) ×2
SPLINT PLASTER GYPS XFAST 3X15 (CAST SUPPLIES) ×1 IMPLANT
SPLINT PLASTER XTRA FASTSET 3X (CAST SUPPLIES)
STOCKINETTE 4X48 STRL (DRAPES) ×3 IMPLANT
SUT ETHIBOND 3-0 V-5 (SUTURE) IMPLANT
SUT ETHILON 3 0 PS 1 (SUTURE) ×6 IMPLANT
SUT ETHILON 4 0 PS 2 18 (SUTURE) ×3 IMPLANT
SUT ETHILON 5 0 CL P 3 (SUTURE) ×3 IMPLANT
SUT ETHILON 8 0 BV130 4 (SUTURE) ×12 IMPLANT
SUT FIBERWIRE 3-0 18 TAPR NDL (SUTURE) ×6
SUT FIBERWIRE 4-0 18 TAPR NDL (SUTURE)
SUT MERSILENE 6 0 P 1 (SUTURE) IMPLANT
SUT NYLON 9 0 VRM6 (SUTURE) IMPLANT
SUT PROLENE 6 0 P 1 18 (SUTURE) IMPLANT
SUT SILK 4 0 PS 2 (SUTURE) IMPLANT
SUT VIC AB 3-0 FS2 27 (SUTURE) ×3 IMPLANT
SUT VICRYL 4-0 PS2 18IN ABS (SUTURE) IMPLANT
SUTURE FIBERWR 3-0 18 TAPR NDL (SUTURE) ×2 IMPLANT
SUTURE FIBERWR 4-0 18 TAPR NDL (SUTURE) IMPLANT
SYR BULB 3OZ (MISCELLANEOUS) IMPLANT
SYR CONTROL 10ML LL (SYRINGE) IMPLANT
TOWEL OR 17X24 6PK STRL BLUE (TOWEL DISPOSABLE) ×6 IMPLANT
TUBE CONNECTING 12'X1/4 (SUCTIONS) ×1
TUBE CONNECTING 12X1/4 (SUCTIONS) ×2 IMPLANT
UNDERPAD 30X30 (UNDERPADS AND DIAPERS) ×3 IMPLANT

## 2017-07-18 NOTE — Anesthesia Procedure Notes (Signed)
Procedure Name: Intubation Date/Time: 07/18/2017 7:25 AM Performed by: Julieta Bellini, CRNA Pre-anesthesia Checklist: Patient identified, Emergency Drugs available, Suction available and Patient being monitored Patient Re-evaluated:Patient Re-evaluated prior to induction Oxygen Delivery Method: Circle system utilized Preoxygenation: Pre-oxygenation with 100% oxygen Induction Type: IV induction Ventilation: Mask ventilation without difficulty Laryngoscope Size: Mac and 4 Grade View: Grade I Tube type: Oral Tube size: 7.5 mm Number of attempts: 1 Airway Equipment and Method: Stylet Placement Confirmation: ETT inserted through vocal cords under direct vision,  positive ETCO2 and breath sounds checked- equal and bilateral Secured at: 24 cm Tube secured with: Tape Dental Injury: Teeth and Oropharynx as per pre-operative assessment  Comments: CRNA attempted DL with Mac 3 unable to view, Dr. Gifford Shave switched to [facility] 3 when laryngoscope blade broke. Masked the patient easily waiting to obtain another handle. DL successful with Mac 4 by Dr. Gifford Shave

## 2017-07-18 NOTE — ED Notes (Signed)
ED Provider at bedside. 

## 2017-07-18 NOTE — ED Notes (Signed)
Plan to go to OR, placed in hosp gown at this time

## 2017-07-18 NOTE — Anesthesia Postprocedure Evaluation (Signed)
Anesthesia Post Note  Patient: Ross Woods  Procedure(s) Performed: LEFT FOREARM ARTERY REPAIR AND TENDON IF NECESSARY (Left Wrist)     Patient location during evaluation: PACU Anesthesia Type: General Level of consciousness: awake and alert Pain management: pain level controlled Vital Signs Assessment: post-procedure vital signs reviewed and stable Respiratory status: spontaneous breathing, nonlabored ventilation and respiratory function stable Cardiovascular status: blood pressure returned to baseline and stable Postop Assessment: no apparent nausea or vomiting Anesthetic complications: no    Last Vitals:  Vitals:   07/18/17 1103 07/18/17 1104  BP:    Pulse: 73 75  Resp:    Temp:    SpO2: 95% 93%    Last Pain:  Vitals:   07/18/17 0450  TempSrc:   PainSc: 8                  Cecile Hearing

## 2017-07-18 NOTE — ED Notes (Signed)
ED Provider at bedside suturing.  

## 2017-07-18 NOTE — Transfer of Care (Signed)
Immediate Anesthesia Transfer of Care Note  Patient: Ross Woods  Procedure(s) Performed: LEFT FOREARM ARTERY REPAIR AND TENDON IF NECESSARY (Left Wrist)  Patient Location: PACU  Anesthesia Type:General  Level of Consciousness: drowsy and patient cooperative  Airway & Oxygen Therapy: Patient Spontanous Breathing and Patient connected to nasal cannula oxygen  Post-op Assessment: Report given to RN, Post -op Vital signs reviewed and stable and Patient moving all extremities X 4  Post vital signs: Reviewed and stable  Last Vitals:  Vitals Value Taken Time  BP 128/85 07/18/2017 10:07 AM  Temp    Pulse 88 07/18/2017 10:08 AM  Resp 23 07/18/2017 10:08 AM  SpO2 99 % 07/18/2017 10:08 AM  Vitals shown include unvalidated device data.  Last Pain:  Vitals:   07/18/17 0450  TempSrc:   PainSc: 8          Complications: No apparent anesthesia complications
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
## 2017-07-18 NOTE — ED Provider Notes (Addendum)
[facility] EMERGENCY DEPARTMENT Provider Note   CSN: [id number] Arrival date & time: 07/18/17  0305  Time seen 04:45 AM   History   Chief Complaint Chief Complaint  Patient presents with  . Extremity Laceration    HPI Ross Woods is a 47 y.o. male.  HPI patient states about 2 AM he was walking around outside and he fell lacerating his left wrist.  He does not know what he cut his wrist on.  He describes profuse bleeding.  Patient had a pressure dressing applied in triage which has controlled the bleeding.  He states he had immediate numbness of his little and ring fingers after the fall.  He is unsure of his last tetanus shot but states he had it done at urgent care with a dog bite and when I look at the records it was 2013.  Patient is right-handed.  He and his wife deny that this is a self-inflicted wound.  PCP Placey, Chales Abrahams, NP   History reviewed. No pertinent past medical history.  There are no active problems to display for this patient.   History reviewed. No pertinent surgical history.      Home Medications    Prior to Admission medications   Medication Sig Start Date End Date Taking? Authorizing Provider  aspirin-sod bicarb-citric acid (ALKA-SELTZER) 325 MG TBEF tablet Take 325 mg by mouth every 6 (six) hours as needed (congestion).    [provider]  dextromethorphan-guaiFENesin (MUCINEX DM) 30-600 MG 12hr tablet Take 1 tablet by mouth 2 (two) times daily as needed for cough.    [provider]  predniSONE (DELTASONE) 20 MG tablet 3 tabs po day one, then 2 po daily x 4 days 01/07/17   Rolan Bucco, MD  traMADol (ULTRAM) 50 MG tablet Take 1 tablet (50 mg total) by mouth every 6 (six) hours as needed. 01/07/17   Rolan Bucco, MD    Family History History reviewed. No pertinent family history.  Social History Social History   Tobacco Use  . Smoking status: Current Every Day Smoker    Packs/day: 0.25    Types:  Cigarettes  . Smokeless tobacco: Never Used  Substance Use Topics  . Alcohol use: Yes    Alcohol/week: 0.0 oz    Comment: 2 beers daily  . Drug use: No  employed doing Aeronautical engineer and moving Has been drinking tonight  Allergies   Iodine   Review of Systems Review of Systems  All other systems reviewed and are negative.    Physical Exam Updated Vital Signs BP (!) 142/91 (BP Location: Right Arm)   Pulse 89   Temp 98.6 F (37 C) (Oral)   Resp 18   Ht  (1.88 m)   Wt 81.6 kg (180 lb)   SpO2 98%   BMI 23.11 kg/m   Vital signs normal    Physical Exam  Constitutional: He is oriented to person, place, and time. He appears well-developed and well-nourished. He appears distressed.  HENT:  Head: Normocephalic and atraumatic.  Right Ear: External ear normal.  Left Ear: External ear normal.  Nose: Nose normal.  Eyes: Conjunctivae and EOM are normal.  Neck: Normal range of motion.  Cardiovascular: Normal rate.  Pulmonary/Chest: Effort normal. No respiratory distress.  Musculoskeletal:  When I unwrapped the wound the gauze is bloodsoaked.  When I remove the gauze there is a squirting of blood.  From what I can see he has a several centimeter laceration on the volar aspect  of his distal forearm on the ulnar aspect.  It was immediately re-wrapped and pressure dressing reapplied until like to get supplies in place.  Patient is able to flex his fingers he is also able to extend them.  The wound was 5.5 cm in length with lacerated tendon  Neurological: He is alert and oriented to person, place, and time. No cranial nerve deficit.  Skin: Skin is warm and dry. No erythema.  Psychiatric: He has a normal mood and affect. His behavior is normal. Thought content normal.  Does appear intoxicated but he is cooperative           ED Treatments / Results  Labs (all labs ordered are listed, but only abnormal results are displayed) Results for orders placed or performed during  the hospital encounter of 07/18/17  CBC with Differential  Result Value Ref Range   WBC 6.6 4.0 - 10.5 K/uL   RBC 3.81 (L) 4.22 - 5.81 MIL/uL   Hemoglobin 11.4 (L) 13.0 - 17.0 g/dL   HCT 40.9 (L) 81.1 - 91.4 %   MCV 89.5 78.0 - 100.0 fL   MCH 29.9 26.0 - 34.0 pg   MCHC 33.4 30.0 - 36.0 g/dL   RDW 78.2 95.6 - 21.3 %   Platelets 260 150 - 400 K/uL   Neutrophils Relative % 61 %   Neutro Abs 4.0 1.7 - 7.7 K/uL   Lymphocytes Relative 25 %   Lymphs Abs 1.7 0.7 - 4.0 K/uL   Monocytes Relative 8 %   Monocytes Absolute 0.5 0.1 - 1.0 K/uL   Eosinophils Relative 5 %   Eosinophils Absolute 0.4 0.0 - 0.7 K/uL   Basophils Relative 1 %   Basophils Absolute 0.1 0.0 - 0.1 K/uL  Basic metabolic panel  Result Value Ref Range   Sodium 139 135 - 145 mmol/L   Potassium 4.1 3.5 - 5.1 mmol/L   Chloride 105 101 - 111 mmol/L   CO2 22 22 - 32 mmol/L   Glucose, Bld 83 65 - 99 mg/dL   BUN 17 6 - 20 mg/dL   Creatinine, Ser 0.86 0.61 - 1.24 mg/dL   Calcium 8.7 (L) 8.9 - 10.3 mg/dL   GFR calc non Af Amer >60 >60 mL/min   GFR calc Af Amer >60 >60 mL/min   Anion gap 12 5 - 15   Laboratory interpretation all normal except anemia    EKG None  Radiology No results found.  Procedures Procedures (including critical care time)  Medications Ordered in ED Medications  lidocaine-EPINEPHrine (XYLOCAINE W/EPI) 2 %-1:200000 (PF) injection 20 mL (20 mLs Infiltration Given 07/18/17 0510)     Initial Impression / Assessment and Plan / ED Course  I have reviewed the triage vital signs and the nursing notes.  Pertinent labs & imaging results that were available during my care of the patient were reviewed by me and considered in my medical decision making (see chart for details).    My PA Upstil did the wound repair because I have a respiratory infection. She called me into the room and showed me the bleeder, I had her tie it off with a 4-0 viacryl suture then the wound was inspected more closely. He has an  obvious tendon laceration then he flexes his fingers and another tendon body is sticking out of the wound.   Melvenia Beam, PA talked to Dr Merlyn Lot, hand specialist and he is going to take the patient to the OR>    Final Clinical Impressions(s) / ED  Diagnoses   Final diagnoses:  Laceration of tendon of left wrist, initial encounter    Pt taken to the OR  Devoria Albe, MD, Iline Oven, Jodelle Gross, MD 07/18/17 1610    Devoria Albe, MD 07/18/17 [phone]

## 2017-07-18 NOTE — Discharge Instructions (Addendum)

## 2017-07-18 NOTE — ED Triage Notes (Signed)
Reports falling on some glass cutting left wrist.  Large lac noted.  Wrapped with kling and coban to stop bleeding.

## 2017-07-18 NOTE — Op Note (Signed)
[id number] 

## 2017-07-18 NOTE — ED Provider Notes (Signed)
LACERATION REPAIR Performed by: Arnoldo Hooker Authorized by: Arnoldo Hooker Consent: Verbal consent obtained. Risks and benefits: risks, benefits and alternatives were discussed Consent given by: patient Patient identity confirmed: provided demographic data Prepped and Draped in normal sterile fashion Wound explored  Laceration Location: left wrist  Laceration Length: 5.5 cm  No Foreign Bodies seen or palpated  Anesthesia: local infiltration  Local anesthetic: lidocaine 2% w/epinephrine  Anesthetic total: 3 ml  Irrigation method: syringe Amount of cleaning: standard  Arterial bleeding at volar wrist, at expected site of ulnar artery 4-0 vicryl used in figure-of-8 pattern to tie off arterial bleeding with success.  Skin closure: N/A  Number of sutures: 3  Technique: figure of 8  Patient tolerance: Patient tolerated the procedure well with no immediate complications.  Wound left open (photos on chart) for consultation with Dr. Merlyn Lot.   After discussion with dr. Merlyn Lot, surgery is anticipated, which he will scheduled. Patient and family updated.     Elpidio Anis, PA-C 07/18/17 0600    Devoria Albe, MD 07/18/17 [phone]

## 2017-07-18 NOTE — H&P (Signed)
Ross Woods is an 47 y.o. male.   Chief Complaint: left wrist laceration HPI: 47 yo rhd male states he fell early this morning and sustained laceration to left wrist/distal forearm.  He was seen in the emergency department where the wound was cleaned and a bleeding vessel tied.  He notes decreased sensation in the small finger.  He reports potentially a previous injury to the left hand.  He states the hand was very functional prior to this injury.  He describes a throbbing pain of 8 out of 10 in severity.  Is alleviated with rest and aggravated with motion.  Case discussed with Edmonia Lynch, PA-C and her note and Dr. Shari Prows Knapp's note from 07/18/2017 reviewed. Xrays viewed and interpreted by me: none Labs reviewed: none  Allergies:  Allergies  Allergen Reactions  . Iodine Hives, Itching and Swelling    History reviewed. No pertinent past medical history.  History reviewed. No pertinent surgical history.  Family History: History reviewed. No pertinent family history.  Social History:   reports that he has been smoking cigarettes.  He has been smoking about 0.25 packs per day. He has never used smokeless tobacco. He reports that he drinks alcohol. He reports that he does not use drugs.  Medications:  (Not in a hospital admission)  Results for orders placed or performed during the hospital encounter of 07/18/17 (from the past 48 hour(s))  CBC with Differential     Status: Abnormal   Collection Time: 07/18/17  6:17 AM  Result Value Ref Range   WBC 6.6 4.0 - 10.5 K/uL   RBC 3.81 (L) 4.22 - 5.81 MIL/uL   Hemoglobin 11.4 (L) 13.0 - 17.0 g/dL   HCT 78.2 (L) 95.6 - 21.3 %   MCV 89.5 78.0 - 100.0 fL   MCH 29.9 26.0 - 34.0 pg   MCHC 33.4 30.0 - 36.0 g/dL   RDW 08.6 57.8 - 46.9 %   Platelets 260 150 - 400 K/uL   Neutrophils Relative % 61 %   Neutro Abs 4.0 1.7 - 7.7 K/uL   Lymphocytes Relative 25 %   Lymphs Abs 1.7 0.7 - 4.0 K/uL   Monocytes Relative 8 %   Monocytes Absolute  0.5 0.1 - 1.0 K/uL   Eosinophils Relative 5 %   Eosinophils Absolute 0.4 0.0 - 0.7 K/uL   Basophils Relative 1 %   Basophils Absolute 0.1 0.0 - 0.1 K/uL    Comment: Performed at [facility], [address], [place], [place] [address]    No results found.   A comprehensive review of systems was negative. Review of Systems: No fevers, chills, night sweats, chest pain, shortness of breath, nausea, vomiting, diarrhea, constipation, easy bleeding or bruising, headaches, dizziness, vision changes, fainting.   Blood pressure 112/80, pulse 93, temperature 98.6 F (37 C), temperature source Oral, resp. rate 18, height  (1.88 m), weight 81.6 kg (180 lb), SpO2 95 %.  General appearance: alert, cooperative and appears stated age Head: Normocephalic, without obvious abnormality, atraumatic Neck: supple, symmetrical, trachea midline Resp: clear to auscultation bilaterally Cardio: regular rate and rhythm Extremities: Intact sensation and capillary refill all digits except left small finger where he notes decreased sensation.  +epl/fpl/io.  His wrist is bandaged.  Photographs from the ER notes show laceration at the volar ulnar aspect of the wrist and distal forearm.  Radial pulse 2+. Pulses: 2+ and symmetric Skin: Skin color, texture, turgor normal. No rashes or lesions Neurologic: Grossly normal Incision/Wound: As  above  Assessment/Plan Left distal forearm/wrist laceration.  Recommend expiration with repair of tendon artery nerve is necessary in the operating room.  Risks, benefits and alternatives of surgery were discussed including risks of blood loss, infection, damage to nerves/vessels/tendons/ligament/bone, failure of surgery, need for additional surgery, complication with wound healing, nonunion, malunion, stiffness.  He voiced understanding of these risks and elected to proceed.    Fielding Mault R 07/18/2017, 6:52 AM

## 2017-07-18 NOTE — Brief Op Note (Signed)
07/18/2017  9:55 AM  PATIENT:  Ross Woods  47 y.o. male  PRE-OPERATIVE DIAGNOSIS:  LEFT FOREARM LACERATION  POST-OPERATIVE DIAGNOSIS:  LEFT FOREARM LACERATION  PROCEDURE:  Procedure(s): LEFT FOREARM ARTERY REPAIR AND TENDON IF NECESSARY (Left)  SURGEON:  Surgeon(s) and Role:    * Betha Loa, MD - Primary  PHYSICIAN ASSISTANT:   ASSISTANTS: none   ANESTHESIA:   general  EBL:  10 mL   BLOOD ADMINISTERED:none  DRAINS: none   LOCAL MEDICATIONS USED:  MARCAINE     SPECIMEN:  No Specimen  DISPOSITION OF SPECIMEN:  N/A  COUNTS:  YES  TOURNIQUET:   Total Tourniquet Time Documented: Forearm (Left) - 113 minutes Total: Forearm (Left) - 113 minutes   DICTATION: .Other Dictation: Dictation Number [id number]  PLAN OF CARE: Discharge to home after PACU  PATIENT DISPOSITION:  PACU - hemodynamically stable.

## 2017-07-19 ENCOUNTER — Encounter (HOSPITAL_COMMUNITY): Payer: Self-pay | Admitting: Orthopedic Surgery

## 2017-07-19 NOTE — Op Note (Signed)
Woods, Ross MEDICAL RECORD ZO:[id number] ACCOUNT [id number] DATE OF BIRTH:[birth year] FACILITY: [facility] LOCATION: [facility] PHYSICIAN:Brenyn Petrey Georges Lynch, MD  OPERATIVE REPORT  DATE OF PROCEDURE:  07/18/2017  PREOPERATIVE DIAGNOSIS:  Left distal forearm/wrist laceration.  POSTOPERATIVE DIAGNOSES:  Left flexor carpi ulnaris laceration, flexor digitorum superficialis to small finger laceration, flexor digitorum profundus to small finger laceration, ulnar artery laceration, ulnar nerve, dorsal sensory branch of ulnar nerve,  dorsal arterial branch.  PROCEDURES PERFORMED:   1. Left wrist exploration of laceration 2. Repair of flexor carpi ulnaris tendon 3. Repair of flexor digitorum profundus to small finger tendon 4. Repair of flexor digitorum superficialis to small finger tendon 5. Repair of ulnar artery under microscope 6. Repair of ulnar nerve under microscope 7. Repair of dorsal sensory branch of ulnar nerve under microscope 8. Repair of dorsal arterial branch under microscope.  SURGEON:  Betha Loa, MD  ASSISTANT:  None.  ANESTHESIA:  General.  INTRAVENOUS FLUIDS:  Per anesthesia flow sheet.  ESTIMATED BLOOD LOSS:  Minimal.  COMPLICATIONS:  None.  SPECIMENS:  None.  TOURNIQUET TIME:  113 minutes.  DISPOSITION:  Stable to PACU.  INDICATIONS:  The patient is a 47 year old male who states he fell last night lacerating his left distal forearm/wrist.  He was seen in the emergency department.  I recommended operative exploration with repair of tendon, artery and nerve as necessary.   Risks, benefits and alternatives of surgery were discussed including the risks of blood loss; infection; damage to nerves vessels, tendons, ligaments, bone; failure of surgery and need for additional surgery; complications with wound healing; continued  pain; weakness and numbness.  He voiced understanding of these risks and elected to proceed.   DESCRIPTION OF PROCEDURE:  After being  identified preoperatively by myself, the patient agreed upon the procedure and site of the procedure.  Surgical site was marked.  Risks, benefits and alternatives of surgery were reviewed and he wished to proceed.   Surgical consent had been signed.  He was given IV Ancef as a preoperative antibiotic prophylaxis.  He was transported to the operating room and placed on the operating table in supine position with the left upper extremity on an arm board.  General  anesthesia was induced by anesthesiologist.  Left upper extremity was prepped and draped in normal sterile orthopedic fashion.  A surgical pause was performed between the surgeon, anesthesia and the operating room staff and all are in agreement as to the  patient, procedure, site of procedure.  The tourniquet at the proximal aspect of the extremity was inflated to 250 mmHg after exsanguination of limb with Esmarch bandage.  The wound was extended both proximally and distally.  It was explored.  There was  complete laceration of the tendon of the FDS to the small finger.  There was some small laceration to the FDS to the ring finger that did not require repair.  The FDS to the long and index fingers were intact.  The FDP to the index, long and ring  fingers were intact.  The FDP to the small finger was lacerated with some muscle fibers remaining.  The FCU tendon was lacerated.  There was a complete laceration of the ulnar artery and ulnar nerve.  The nerve had branched and the dorsal sensory branch  of the ulnar nerve was lacerated in a separate location.  There was also a dorsal arterial branch running with the dorsal branch of the ulnar nerve, which was lacerated.  There was a laceration into the periosteum  down to the bone without fracture noted.   The wound was copiously irrigated with sterile saline.  The median nerve was identified and was intact.  The FDS and FDP tendons to the small finger were both repaired separately using a 3-0 FiberWire  suture in a figure-of-eight fashion.  This provided  good reapposition of the tendon ends.  The microscope was then brought in.  The dorsal arterial branch at the ulnar side of the forearm and wrist was prepared.  The microscope was used for microdissection to clear the arterial ends and clean the lumen  of clot.  An 8-0 nylon suture was used in interrupted circumferential fashion to reapproximate the arterial ends.  Good reapproximation was obtained.  Attempts at using the 8-0 nylon to reapproximate the dorsal sensory branch of the ulnar nerve were  unsuccessful as the suture kept breaking.  A 5-0 nylon suture was then used in epineurial fashion to reapproximate the ends of the dorsal sensory branch of the ulnar nerve.  Good reapposition was obtained.  The main trunk of the ulnar artery was then  repaired with the 8-0 nylon suture in an interrupted circumferential fashion.  The microscope had again been used for microdissection to prepare the ends and clear the lumen of clot.  The 8-0 nylon suture was then used to repair the ulnar nerve.  The  fascicles of the nerve were realigned based on their location and size.  Epineurial sutures were placed.  The FCU tendon was then repaired with the 3-0 FiberWire suture in a modified Kessler technique and oversewn with a figure-of-eight suture.  Good  reapproximation was obtained.  The wound was then closed with 3-0 nylon in a horizontal mattress fashion.  The wound was injected with 0.25% plain Marcaine to aid in postoperative analgesia.  It was dressed with sterile Xeroform, 4x4's, and wrapped with  a Kerlix bandage.  A dorsal blocking splint was placed with the wrist flexed approximately 40 degrees and the MPs flexed and the IPs extended.  This was wrapped with Kerlix and Ace bandage.  The tourniquet had been deflated prior to repairing the FCU  tendon and good flow across the arterial branches was noted.  There was brisk capillary refill in all fingertips.   Bipolar electrocautery was used to obtain hemostasis within the wound.  The operative drapes were broken down.  The patient was awoken from  anesthesia, safely.  He was transferred back to a stretcher and taken to PACU in stable condition.  I will see him back in the office in 1 week for postoperative followup.  I will give him Norco 5/325 one to two p.o. q. 6 hours p.r.n. pain, dispense  #30, and Bactrim-DS 1 p.o. b.i.d. x7 days.  AN/NUANCE  D:07/18/2017 T:07/18/2017 JOB:[id number]

## 2019-03-12 IMAGING — CR DG CHEST 2V
2 series · 2 of 2 positions shown · non-contrast
Comparison: 10/07/2011

CLINICAL DATA: Per order- SOB Pt reports experiencing productive
cough congestion, dyspnea with exertion and some weakness today. Pt
has no other chest complaints at this time. PT HX: current smoker

EXAM:
CHEST  2 VIEW

[w chest pa]
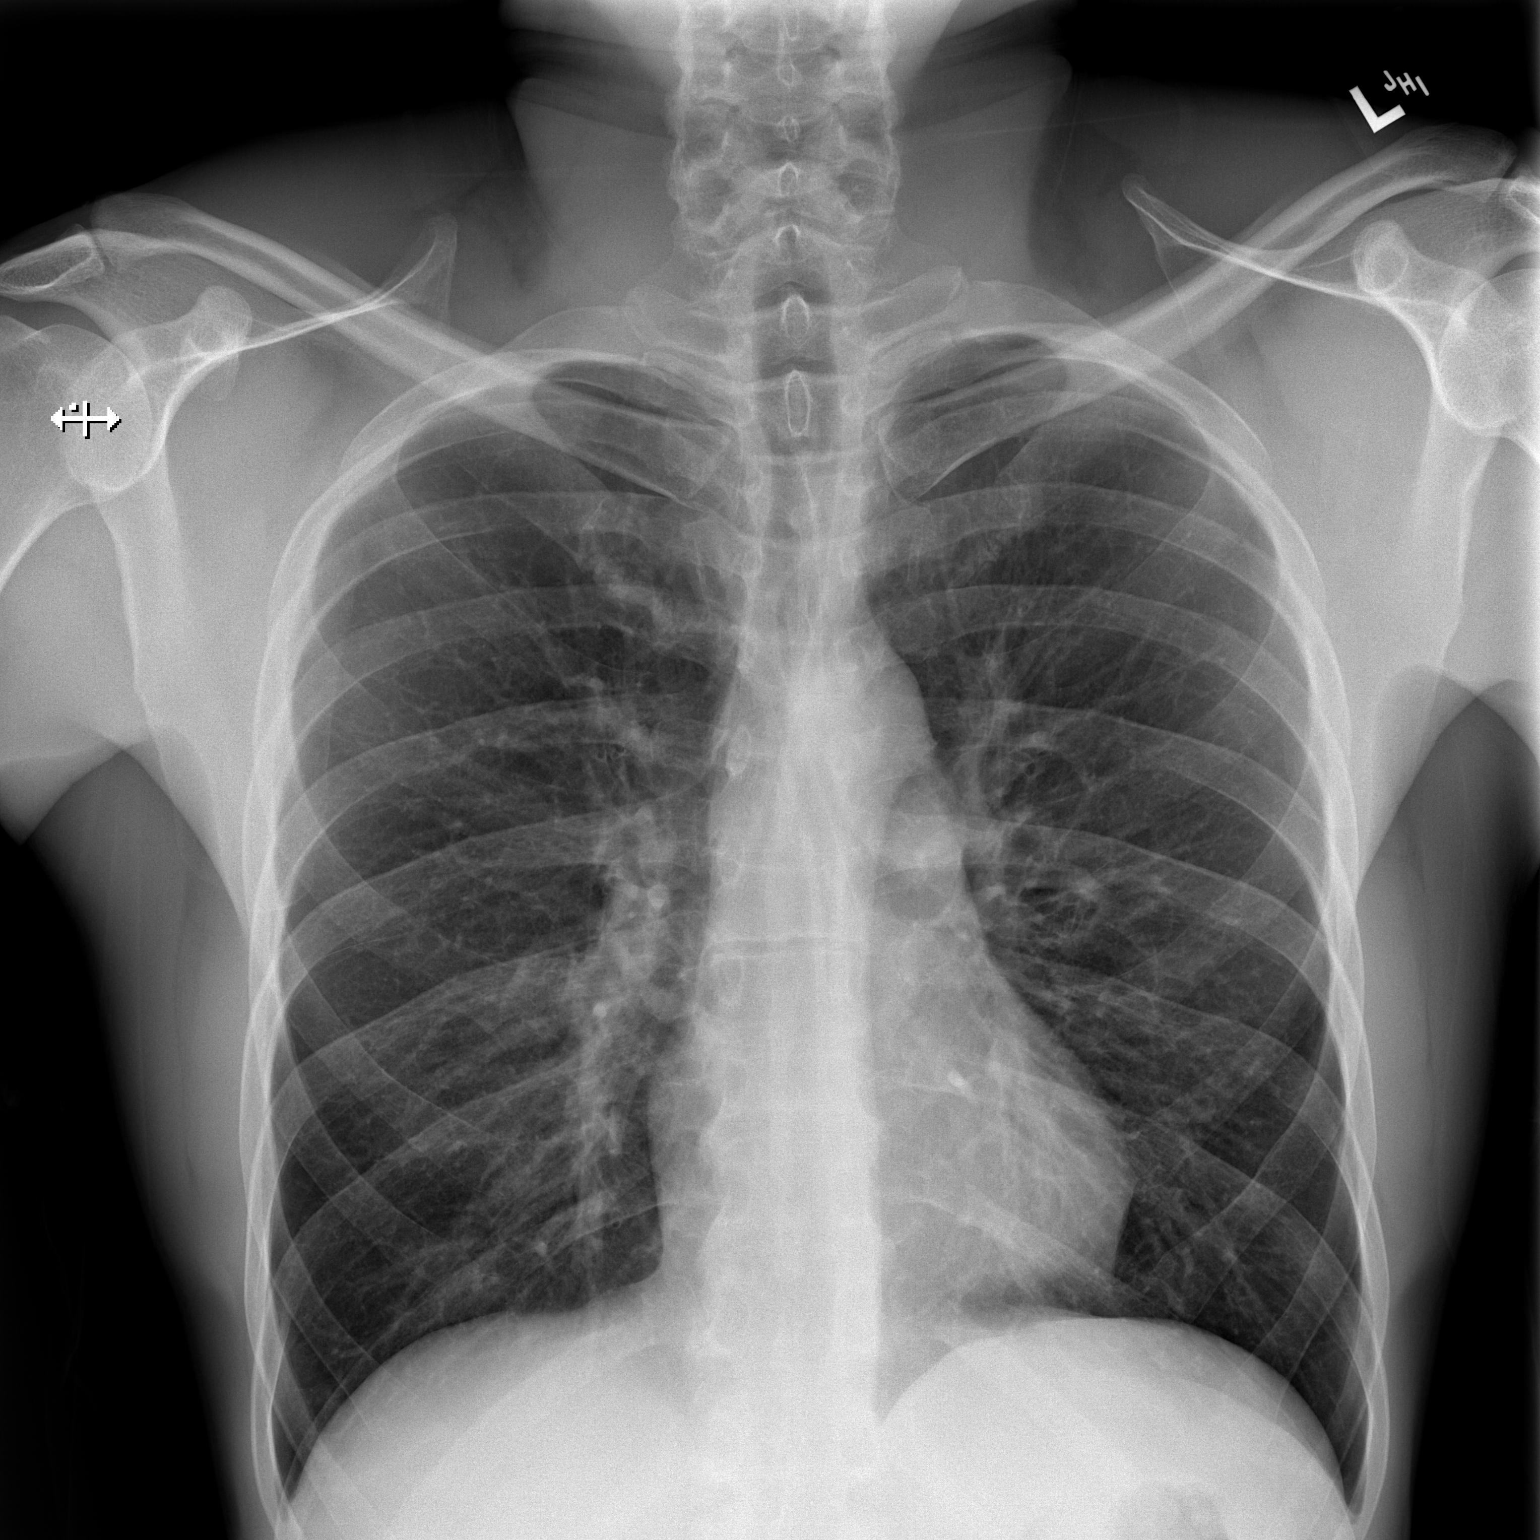

[w chest lat]
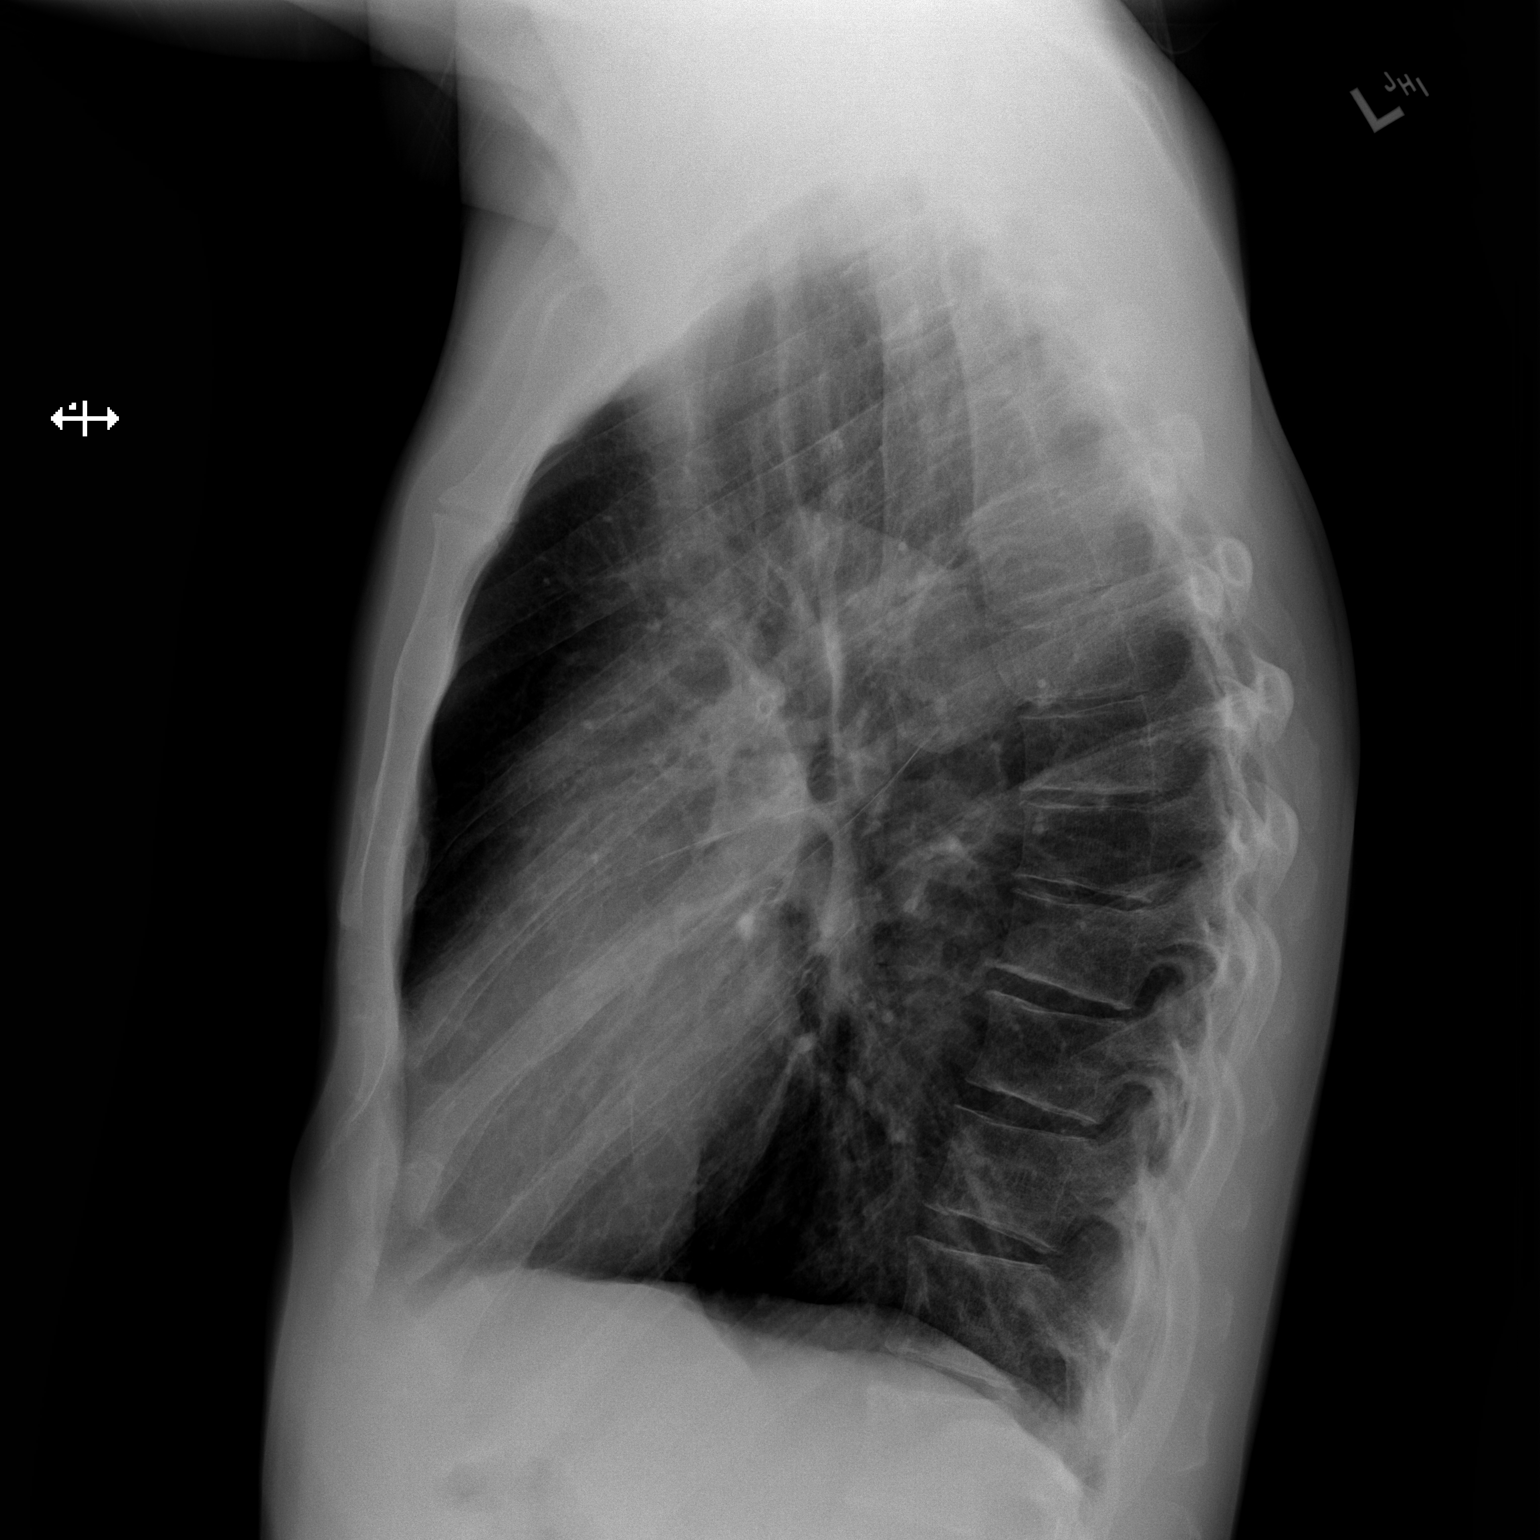

[2 of 2 positions shown; findings below may reference images not displayed]

FINDINGS: Heart size is normal. There is mild perihilar peribronchial
thickening. There are no focal consolidations or pleural effusions.
No pulmonary edema.
IMPRESSION: Mild bronchitic changes.  No focal acute pulmonary abnormality.

## 2021-12-05 ENCOUNTER — Emergency Department (HOSPITAL_COMMUNITY)
Admission: EM | Admit: 2021-12-05 | Discharge: 2021-12-06 | Disposition: A | Discharge status: Home / Self Care | Payer: Self-pay | Attending: Emergency Medicine | Admitting: Emergency Medicine

## 2021-12-05 ENCOUNTER — Encounter (HOSPITAL_COMMUNITY): Payer: Self-pay | Admitting: Emergency Medicine

## 2021-12-05 ENCOUNTER — Other Ambulatory Visit: Payer: Self-pay

## 2021-12-05 ENCOUNTER — Emergency Department (HOSPITAL_COMMUNITY): Payer: Self-pay

## 2021-12-05 DIAGNOSIS — Z20822 Contact with and (suspected) exposure to covid-19: Secondary | ICD-10-CM | POA: Insufficient documentation

## 2021-12-05 DIAGNOSIS — Z7982 Long term (current) use of aspirin: Secondary | ICD-10-CM | POA: Insufficient documentation

## 2021-12-05 DIAGNOSIS — J441 Chronic obstructive pulmonary disease with (acute) exacerbation: Secondary | ICD-10-CM

## 2021-12-05 DIAGNOSIS — F17210 Nicotine dependence, cigarettes, uncomplicated: Secondary | ICD-10-CM | POA: Insufficient documentation

## 2021-12-05 LAB — CBC WITH DIFFERENTIAL/PLATELET
Abs Immature Granulocytes: 0.03 10*3/uL (ref 0.00–0.07)
Basophils Absolute: 0.1 10*3/uL (ref 0.0–0.1)
Basophils Relative: 1 %
Eosinophils Absolute: 0.9 10*3/uL — ABNORMAL HIGH (ref 0.0–0.5)
Eosinophils Relative: 10 %
HCT: 45.2 % (ref 39.0–52.0)
Hemoglobin: 15.1 g/dL (ref 13.0–17.0)
Immature Granulocytes: 0 %
Lymphocytes Relative: 19 %
Lymphs Abs: 1.7 10*3/uL (ref 0.7–4.0)
MCH: 29.7 pg (ref 26.0–34.0)
MCHC: 33.4 g/dL (ref 30.0–36.0)
MCV: 88.8 fL (ref 80.0–100.0)
Monocytes Absolute: 0.8 10*3/uL (ref 0.1–1.0)
Monocytes Relative: 9 %
Neutro Abs: 5.6 10*3/uL (ref 1.7–7.7)
Neutrophils Relative %: 61 %
Platelets: 419 10*3/uL — ABNORMAL HIGH (ref 150–400)
RBC: 5.09 MIL/uL (ref 4.22–5.81)
RDW: 14.3 % (ref 11.5–15.5)
WBC: 9.1 10*3/uL (ref 4.0–10.5)
nRBC: 0 % (ref 0.0–0.2)

## 2021-12-05 LAB — BASIC METABOLIC PANEL
Anion gap: 11 (ref 5–15)
BUN: 14 mg/dL (ref 6–20)
CO2: 25 mmol/L (ref 22–32)
Calcium: 8.9 mg/dL (ref 8.9–10.3)
Chloride: 102 mmol/L (ref 98–111)
Creatinine, Ser: 1.03 mg/dL (ref 0.61–1.24)
GFR, Estimated: >60 mL/min (ref >60)
Glucose, Bld: 114 mg/dL — ABNORMAL HIGH (ref 70–99)
Potassium: 4 mmol/L (ref 3.5–5.1)
Sodium: 138 mmol/L (ref 135–145)

## 2021-12-05 MED ORDER — PREDNISONE 20 MG PO TABS
60.0000 mg | ORAL_TABLET | Freq: Once | ORAL | Status: AC
  Administered 2021-12-05: 60 mg via ORAL
  Filled 2021-12-05: qty 3

## 2021-12-05 MED ORDER — IPRATROPIUM-ALBUTEROL 0.5-2.5 (3) MG/3ML IN SOLN
3.0000 mL | Freq: Once | RESPIRATORY_TRACT | Status: AC
  Administered 2021-12-05: 3 mL via RESPIRATORY_TRACT
  Filled 2021-12-05: qty 3

## 2021-12-05 NOTE — ED Provider Triage Note (Signed)
  Emergency Medicine Provider Triage Evaluation Note  MRN:  [id number]  Arrival date & time: 12/05/21    Medically screening exam initiated at 10:53 PM.   CC:   SOB and Wheezing  HPI:  Ross Woods is a 51 y.o. year-old male presents to the ED with chief complaint of SOB and wheezing for the past 2 days.  Denies ever having had this before.  Denies any medical problems.  Denies any cough, fever, sore, throat, or chills.  History provided by patient. ROS:  -As included in HPI PE:   Vitals:   12/05/21 2229  BP: (!) 159/117  Pulse: 88  Resp: 20  Temp: 98.1 F (36.7 C)  SpO2: 97%    Non-toxic appearing No respiratory distress  MDM:  Based on signs and symptoms, bronchospasm is highest on my differential, followed by COVID. I've ordered labs and imaging and a neb in triage to expedite lab/diagnostic workup.  Patient was informed that the remainder of the evaluation will be completed by another provider, this initial triage assessment does not replace that evaluation, and the importance of remaining in the ED until their evaluation is complete.    Montine Circle, PA-C 12/05/21 2255

## 2021-12-05 NOTE — ED Triage Notes (Signed)
Pt c/o shortness of breath and wheezing x 2 days.

## 2021-12-06 LAB — SARS CORONAVIRUS 2 BY RT PCR: SARS Coronavirus 2 by RT PCR: NEGATIVE

## 2021-12-06 MED ORDER — ALBUTEROL SULFATE HFA 108 (90 BASE) MCG/ACT IN AERS
2.0000 | INHALATION_SPRAY | Freq: Four times a day (QID) | RESPIRATORY_TRACT | Status: DC
  Administered 2021-12-06: 2 via RESPIRATORY_TRACT
  Filled 2021-12-06: qty 6.7

## 2021-12-06 MED ORDER — PREDNISONE 20 MG PO TABS
60.0000 mg | ORAL_TABLET | Freq: Once | ORAL | Status: AC
  Administered 2021-12-06: 60 mg via ORAL
  Filled 2021-12-06: qty 3

## 2021-12-06 MED ORDER — PREDNISONE 10 MG PO TABS: 40.0000 mg | ORAL_TABLET | Freq: Every day | ORAL | 0 refills | Status: DC

## 2021-12-06 MED ORDER — IPRATROPIUM-ALBUTEROL 0.5-2.5 (3) MG/3ML IN SOLN
3.0000 mL | Freq: Once | RESPIRATORY_TRACT | Status: AC
  Administered 2021-12-06: 3 mL via RESPIRATORY_TRACT
  Filled 2021-12-06: qty 3

## 2021-12-06 NOTE — ED Provider Notes (Addendum)
[facility] EMERGENCY DEPARTMENT Provider Note   CSN: [id number] Arrival date & time: 12/05/21  2222     History  Chief Complaint  Patient presents with   Shortness of Breath    Ross Woods is a 51 y.o. male.  Patient is an active smoker.  Chest x-ray done via triage suggestive of COPD.  Patient states that he has had a lot of productive phlegm shortness of breath and wheezing for 2 days.  Denies fever.  Patient past medical history significant for being a smoker quarter pack a day.  No known history of COPD or bronchitis.  Patient's oxygen saturations on room air are good mid 90s to upper 90s.       Home Medications Prior to Admission medications   Medication Sig Start Date End Date Taking? Authorizing Provider  aspirin EC 325 MG tablet Take 1 tablet (325 mg total) by mouth daily. 07/18/17   Betha Loa, MD  dextromethorphan-guaiFENesin [facility] DM) 30-600 MG 12hr tablet Take 1 tablet by mouth 2 (two) times daily as needed for cough.    [provider]  HYDROcodone-acetaminophen (NORCO) 5-325 MG tablet 1-2 tabs po q6 hours prn pain 07/18/17   Betha Loa, MD  predniSONE (DELTASONE) 20 MG tablet 3 tabs po day one, then 2 po daily x 4 days 01/07/17   Rolan Bucco, MD  sulfamethoxazole-trimethoprim (BACTRIM DS) 800-160 MG tablet Take 1 tablet by mouth 2 (two) times daily. 07/18/17   Betha Loa, MD      Allergies    Iodine    Review of Systems   Review of Systems  Constitutional:  Negative for chills and fever.  HENT:  Positive for congestion. Negative for ear pain and sore throat.   Eyes:  Negative for pain and visual disturbance.  Respiratory:  Positive for cough, shortness of breath and wheezing.   Cardiovascular:  Negative for chest pain and palpitations.  Gastrointestinal:  Negative for abdominal pain and vomiting.  Genitourinary:  Negative for dysuria and hematuria.  Musculoskeletal:  Negative for arthralgias and back pain.  Skin:   Negative for color change and rash.  Neurological:  Negative for seizures and syncope.  All other systems reviewed and are negative.   Physical Exam Updated Vital Signs BP (!) 130/96 (BP Location: Left Arm)   Pulse 90   Temp (!) 97.5 F (36.4 C) (Oral)   Resp 18   SpO2 96%  Physical Exam Vitals and nursing note reviewed.  Constitutional:      General: He is not in acute distress.    Appearance: He is well-developed.  HENT:     Head: Normocephalic and atraumatic.  Eyes:     Extraocular Movements: Extraocular movements intact.     Conjunctiva/sclera: Conjunctivae normal.     Pupils: Pupils are equal, round, and reactive to light.  Cardiovascular:     Rate and Rhythm: Normal rate and regular rhythm.     Heart sounds: No murmur heard. Pulmonary:     Effort: Pulmonary effort is normal. No respiratory distress.     Breath sounds: Examination of the right-upper field reveals wheezing and rhonchi. Examination of the left-upper field reveals wheezing and rhonchi. Examination of the right-middle field reveals wheezing and rhonchi. Examination of the left-middle field reveals wheezing and rhonchi. Examination of the right-lower field reveals wheezing and rhonchi. Examination of the left-lower field reveals wheezing and rhonchi. Wheezing and rhonchi present. No decreased breath sounds or rales.  Chest:     Chest wall:  No tenderness.  Abdominal:     Palpations: Abdomen is soft.     Tenderness: There is no abdominal tenderness.  Musculoskeletal:        General: No swelling.     Cervical back: Neck supple.     Right lower leg: No edema.     Left lower leg: No edema.  Skin:    General: Skin is warm and dry.     Capillary Refill: Capillary refill takes less than 2 seconds.  Neurological:     General: No focal deficit present.     Mental Status: He is alert and oriented to person, place, and time.  Psychiatric:        Mood and Affect: Mood normal.     ED Results / Procedures /  Treatments   Labs (all labs ordered are listed, but only abnormal results are displayed) Labs Reviewed  CBC WITH DIFFERENTIAL/PLATELET - Abnormal; Notable for the following components:      Result Value   Platelets 419 (*)    Eosinophils Absolute 0.9 (*)    All other components within normal limits  BASIC METABOLIC PANEL - Abnormal; Notable for the following components:   Glucose, Bld 114 (*)    All other components within normal limits  SARS CORONAVIRUS 2 BY RT PCR    EKG EKG Interpretation  Date/Time:  Friday December 05 2021 23:19:09 EDT Ventricular Rate:  82 PR Interval:  152 QRS Duration: 78 QT Interval:  364 QTC Calculation: 425 R Axis:   196 Text Interpretation: Normal sinus rhythm Right superior axis deviation Septal infarct , age undetermined Abnormal ECG When compared with ECG of 05-Dec-2021 22:57, PREVIOUS ECG IS PRESENT No previous ECGs available Confirmed by Vanetta Mulders [phone]) on 12/06/2021 12:39:50 PM  Radiology DG Chest 2 View  Result Date: 12/05/2021 CLINICAL DATA:  Shortness of breath. EXAM: CHEST - 2 VIEW COMPARISON:  01/07/2017. FINDINGS: The heart size and mediastinal contours are within normal limits. There is hyperinflation of the lungs with increased AP diameter of the chest suggesting COPD. No consolidation, effusion, or pneumothorax. Mild degenerative changes are noted in the thoracic spine. IMPRESSION: 1. No active cardiopulmonary disease. 2. Chronic obstructive pulmonary disease. Electronically Signed   By: Thornell Sartorius M.D.   On: 12/05/2021 23:38    Procedures Procedures    Medications Ordered in ED Medications  ipratropium-albuterol (DUONEB) 0.5-2.5 (3) MG/3ML nebulizer solution 3 mL (has no administration in time range)  predniSONE (DELTASONE) tablet 60 mg (has no administration in time range)  ipratropium-albuterol (DUONEB) 0.5-2.5 (3) MG/3ML nebulizer solution 3 mL (3 mLs Nebulization Given 12/05/21 2254)  predniSONE (DELTASONE) tablet 60  mg (60 mg Oral Given 12/05/21 2254)    ED Course/ Medical Decision Making/ A&P                           Medical Decision Making Risk Prescription drug management.   Patient still with significant wheezing throughout the lungs bilaterally.  Chest x-ray negative.  Will give DuoNeb treatment here prednisone 60 mg and reassess.  If able to get under control we will discharge patient with prednisone and albuterol inhaler.  Suspect COPD.  Labs CBC normal basic metabolic panel without any electrolyte abnormalities COVID testing negative and chest x-ray consistent with chronic obstructive pulmonary disease.  Clinically suspect exacerbation of COPD.  Patient wheezing significantly improved with duo nebulizer treatment.  Still some faint wheezing but moving air better patient feels much better.  We will discharge with albuterol inhaler and a course of prednisone.  Patient given 60 mg prednisone here.   Final Clinical Impression(s) / ED Diagnoses Final diagnoses:  COPD exacerbation [facility])    Rx / DC Orders ED Discharge Orders     None         Fredia Sorrow, MD 12/06/21 1252    Fredia Sorrow, MD 12/06/21 1346

## 2021-12-06 NOTE — Discharge Instructions (Addendum)
Use your albuterol inhaler 2 puffs every 6 hours for the next 7 days then as needed.  Take the prednisone for the next 5 days as directed.  Prescription provided.  Make an appointment to follow-up with your primary care doctor for further work-up.  This appears to be secondary to chronic obstructive pulmonary disease which could be secondary to the tobacco use.  If not able to follow-up with your provider.  You can follow-up with the wellness clinic.  Information provided above.  Return for any new or worse symptoms.  Work note provided.

## 2023-12-05 ENCOUNTER — Emergency Department (HOSPITAL_COMMUNITY)
Admission: EM | Admit: 2023-12-05 | Discharge: 2023-12-06 | Disposition: A | Discharge status: Home / Self Care | Payer: Self-pay | Attending: Emergency Medicine | Admitting: Emergency Medicine

## 2023-12-05 ENCOUNTER — Encounter (HOSPITAL_COMMUNITY): Payer: Self-pay | Admitting: *Deleted

## 2023-12-05 ENCOUNTER — Other Ambulatory Visit: Payer: Self-pay

## 2023-12-05 DIAGNOSIS — Z7952 Long term (current) use of systemic steroids: Secondary | ICD-10-CM | POA: Insufficient documentation

## 2023-12-05 DIAGNOSIS — Z7982 Long term (current) use of aspirin: Secondary | ICD-10-CM | POA: Insufficient documentation

## 2023-12-05 DIAGNOSIS — J441 Chronic obstructive pulmonary disease with (acute) exacerbation: Secondary | ICD-10-CM | POA: Insufficient documentation

## 2023-12-05 NOTE — ED Triage Notes (Signed)
 The pt is have extreme sob since yesterday his inhaler is not helping. He still smokes also

## 2023-12-06 ENCOUNTER — Emergency Department (HOSPITAL_COMMUNITY): Payer: Self-pay

## 2023-12-06 LAB — BASIC METABOLIC PANEL WITH GFR
Anion gap: 11 (ref 5–15)
BUN: 16 mg/dL (ref 6–20)
CO2: 26 mmol/L (ref 22–32)
Calcium: 8.9 mg/dL (ref 8.9–10.3)
Chloride: 100 mmol/L (ref 98–111)
Creatinine, Ser: 1.08 mg/dL (ref 0.61–1.24)
GFR, Estimated: >60 mL/min (ref >60)
Glucose, Bld: 101 mg/dL — ABNORMAL HIGH (ref 70–99)
Potassium: 4.3 mmol/L (ref 3.5–5.1)
Sodium: 137 mmol/L (ref 135–145)

## 2023-12-06 LAB — CBC
HCT: 44.8 % (ref 39.0–52.0)
Hemoglobin: 14.6 g/dL (ref 13.0–17.0)
MCH: 29.4 pg (ref 26.0–34.0)
MCHC: 32.6 g/dL (ref 30.0–36.0)
MCV: 90.3 fL (ref 80.0–100.0)
Platelets: 284 K/uL (ref 150–400)
RBC: 4.96 MIL/uL (ref 4.22–5.81)
RDW: 14.6 % (ref 11.5–15.5)
WBC: 8.1 K/uL (ref 4.0–10.5)
nRBC: 0 % (ref 0.0–0.2)

## 2023-12-06 LAB — RESP PANEL BY RT-PCR (RSV, FLU A&B, COVID)  RVPGX2
Influenza A by PCR: NEGATIVE
Influenza B by PCR: NEGATIVE
Resp Syncytial Virus by PCR: NEGATIVE
SARS Coronavirus 2 by RT PCR: NEGATIVE

## 2023-12-06 LAB — TROPONIN I (HIGH SENSITIVITY)
Troponin I (High Sensitivity): 7 ng/L (ref <18)
Troponin I (High Sensitivity): 8 ng/L (ref <18)

## 2023-12-06 LAB — BRAIN NATRIURETIC PEPTIDE: B Natriuretic Peptide: 32.3 pg/mL (ref 0.0–100.0)

## 2023-12-06 MED ORDER — IPRATROPIUM BROMIDE 0.02 % IN SOLN
0.5000 mg | Freq: Once | RESPIRATORY_TRACT | Status: AC
  Administered 2023-12-06: 0.5 mg via RESPIRATORY_TRACT
  Filled 2023-12-06: qty 2.5

## 2023-12-06 MED ORDER — PREDNISONE 20 MG PO TABS: ORAL_TABLET | ORAL | 0 refills | Status: AC

## 2023-12-06 MED ORDER — ALBUTEROL SULFATE (2.5 MG/3ML) 0.083% IN NEBU
10.0000 mg/h | INHALATION_SOLUTION | Freq: Once | RESPIRATORY_TRACT | Status: AC
  Administered 2023-12-06: 10 mg/h via RESPIRATORY_TRACT
  Filled 2023-12-06: qty 3

## 2023-12-06 MED ORDER — ALBUTEROL SULFATE (2.5 MG/3ML) 0.083% IN NEBU
5.0000 mg | INHALATION_SOLUTION | Freq: Once | RESPIRATORY_TRACT | Status: AC
  Administered 2023-12-06: 5 mg via RESPIRATORY_TRACT
  Filled 2023-12-06: qty 6

## 2023-12-06 MED ORDER — IPRATROPIUM-ALBUTEROL 0.5-2.5 (3) MG/3ML IN SOLN
3.0000 mL | Freq: Once | RESPIRATORY_TRACT | Status: AC
  Administered 2023-12-06: 3 mL via RESPIRATORY_TRACT

## 2023-12-06 MED ORDER — METHYLPREDNISOLONE SODIUM SUCC 125 MG IJ SOLR
125.0000 mg | Freq: Once | INTRAMUSCULAR | Status: AC
  Administered 2023-12-06: 125 mg via INTRAVENOUS
  Filled 2023-12-06: qty 2

## 2023-12-06 MED ORDER — ALBUTEROL SULFATE (2.5 MG/3ML) 0.083% IN NEBU: 2.5000 mg | INHALATION_SOLUTION | Freq: Four times a day (QID) | RESPIRATORY_TRACT | 1 refills | Status: AC | PRN

## 2023-12-06 MED ORDER — ALBUTEROL SULFATE HFA 108 (90 BASE) MCG/ACT IN AERS: 1.0000 | INHALATION_SPRAY | Freq: Four times a day (QID) | RESPIRATORY_TRACT | 0 refills | Status: AC | PRN

## 2023-12-06 NOTE — ED Provider Notes (Signed)
 [name]   CSN: [id number] Arrival date & time: 12/05/23  2303     Patient presents with: Shortness of Breath   Ross Woods is a 53 y.o. male.   The history is provided by the patient and medical records.  Shortness of Breath  53 y.o. M with hx of COPD, presenting to the ED with SOB.  States has been feeling this way for about 3 days or so.  He reports productive cough with thick phlegm.  Denies any hemoptysis.  No noted fever or sick contacts.  States childhood history of asthma.  Does normally have trouble with his breathing during change in season.  Was given neb treatment and albuterol  inhaler in triage but has not had any significant change.  Prior to Admission medications   Medication Sig Start Date End Date Taking? Authorizing Provider  aspirin  EC 325 MG tablet Take 1 tablet (325 mg total) by mouth daily. 07/18/17   Murrell Drivers, MD  dextromethorphan-guaiFENesin (MUCINEX DM) 30-600 MG 12hr tablet Take 1 tablet by mouth 2 (two) times daily as needed for cough.    [provider]  HYDROcodone -acetaminophen  (NORCO) 5-325 MG tablet 1-2 tabs po q6 hours prn pain 07/18/17   Kuzma, Kevin, MD  predniSONE  (DELTASONE ) 10 MG tablet Take 4 tablets (40 mg total) by mouth daily. 12/06/21   Zackowski, Scott, MD  predniSONE  (DELTASONE ) 20 MG tablet 3 tabs po day one, then 2 po daily x 4 days 01/07/17   Lenor Hollering, MD  sulfamethoxazole -trimethoprim  (BACTRIM  DS) 800-160 MG tablet Take 1 tablet by mouth 2 (two) times daily. 07/18/17   Kuzma, Kevin, MD    Allergies: Iodine    Review of Systems  Respiratory:  Positive for shortness of breath.   All other systems reviewed and are negative.   Updated Vital Signs Ht 6' 2 (1.88 m)   Wt 81.6 kg   BMI 23.10 kg/m   Physical Exam Vitals and nursing note reviewed.  Constitutional:      Appearance: He is well-developed.  HENT:     Head: Normocephalic and atraumatic.   Eyes:     Conjunctiva/sclera: Conjunctivae normal.     Pupils: Pupils are equal, round, and reactive to light.  Cardiovascular:     Rate and Rhythm: Normal rate and regular rhythm.     Heart sounds: Normal heart sounds.  Pulmonary:     Effort: Pulmonary effort is normal.     Breath sounds: Wheezing present.     Comments: Gets winded with conversation, does have diffuse inspiratory and expiratory wheezes throughout Abdominal:     General: Bowel sounds are normal.     Palpations: Abdomen is soft.  Musculoskeletal:        General: Normal range of motion.     Cervical back: Normal range of motion.  Skin:    General: Skin is warm and dry.  Neurological:     Mental Status: He is alert and oriented to person, place, and time.     (all labs ordered are listed, but only abnormal results are displayed) Labs Reviewed  BASIC METABOLIC PANEL WITH GFR - Abnormal; Notable for the following components:      Result Value   Glucose, Bld 101 (*)    All other components within normal limits  RESP PANEL BY RT-PCR (RSV, FLU A&B, COVID)  [id number]  CBC  BRAIN NATRIURETIC PEPTIDE  TROPONIN I (HIGH SENSITIVITY)  TROPONIN I (HIGH SENSITIVITY)  EKG: None  Radiology: DG Chest 2 View Result Date: 12/06/2023 CLINICAL DATA:  Shortness of breath, chest pain EXAM: CHEST - 2 VIEW COMPARISON:  12/05/2021 FINDINGS: There is hyperinflation of the lungs compatible with COPD. Heart and mediastinal contours are within normal limits. No focal opacities or effusions. No acute bony abnormality. IMPRESSION: COPD.  No active disease. Electronically Signed   By: Franky Crease M.D.   On: 12/06/2023 00:20     Procedures   CRITICAL CARE Performed by: Olam CHRISTELLA Sloc[place]   Total critical care time: 45 minutes  Critical care time was exclusive of separately billable procedures and treating other patients.  Critical care was necessary to treat or prevent imminent or life-threatening deterioration.  Critical care was  time spent personally by me on the following activities: development of treatment plan with patient and/or surrogate as well as nursing, discussions with consultants, evaluation of patient's response to treatment, examination of patient, obtaining history from patient or surrogate, ordering and performing treatments and interventions, ordering and review of laboratory studies, ordering and review of radiographic studies, pulse oximetry and re-evaluation of patient's condition.   Medications Ordered in the ED  ipratropium-albuterol  (DUONEB) 0.5-2.5 (3) MG/3ML nebulizer solution 3 mL (3 mLs Nebulization Given 12/06/23 0046)  albuterol  (PROVENTIL ) (2.5 MG/3ML) 0.083% nebulizer solution 5 mg (5 mg Nebulization Given 12/06/23 0151)  ipratropium (ATROVENT) nebulizer solution 0.5 mg (0.5 mg Nebulization Given 12/06/23 0152)  methylPREDNISolone sodium succinate (SOLU-MEDROL) 125 mg/2 mL injection 125 mg (125 mg Intravenous Given 12/06/23 0152)  albuterol  (PROVENTIL ) (2.5 MG/3ML) 0.083% nebulizer solution (10 mg/hr Nebulization Given 12/06/23 0243)                                    Medical Decision Making Amount and/or Complexity of Data Reviewed Radiology: ordered and independent interpretation performed. ECG/medicine tests: ordered and independent interpretation performed.  Risk Prescription drug management.   53 y.o. M here with SOB x3 days.  Usually has trouble during change of season.  Childhood asthma, now has COPD.  Has had productive cough but no sick contacts.  Neb in triage without change.  Afebrile, non-toxic.  Due to since expiratory wheezes throughout.  Labs are grossly reassuring without leukocytosis or electrolyte derangement.  Chest x-ray without acute findings.  Will add on RVP, plan for Solu-Medrol, repeat DuoNebs.  2:30 AM Patient reassed after additional neb here.  Still has profound inspriatory/expiratory wheezes.  Still does not feel improved.  Will start on CAT.  4:05  AM Patient has completed CAT, states he is feeling significantly better.  He does still have some expiratory wheezing but reports feeling back to baseline.  He is eager to go home and feels well enough to do so.  Will refill his home neb solution, continue prednisone  taper, refill inhalers.  He was instructed to do nebs every 4-6 hours over the next 24 hours or so.  Encourage close follow-up with PCP.  Return here for new concerns.  Final diagnoses:  COPD exacerbation [facility])    ED Discharge Orders          Ordered    albuterol  (PROVENTIL ) (2.5 MG/3ML) 0.083% nebulizer solution  Every 6 hours PRN        12/06/23 0406    predniSONE  (DELTASONE ) 20 MG tablet        12/06/23 0406    albuterol  (VENTOLIN  HFA) 108 (90 Base) MCG/ACT inhaler  Every 6 hours  PRN        12/06/23 0406               Jarold Olam HERO, PA-C 12/06/23 0411    Jerral Meth, MD 12/06/23 [phone]

## 2023-12-06 NOTE — ED Notes (Signed)
RT called for continuous neb 

## 2023-12-06 NOTE — ED Notes (Signed)
 Lab called regarding covid/flu/rsv swab, sent at 0143 not in process yet. Lab states they have the swab and will run it

## 2023-12-06 NOTE — Discharge Instructions (Signed)
 Labs and chest x-ray today looked good.  Appears you are having a COPD exacerbation. I have refilled your medications and sent in new prescription for prednisone  taper. Recommend nebs every 4-6 hours or when needed over the next 24 hours. Keep your inhaler with you at all times. Please follow-up with your primary care doctor. Return here for new concerns.

## 2023-12-06 NOTE — ED Notes (Signed)
 RT at bedside.

## 2023-12-06 NOTE — ED Notes (Signed)
 Pt is still having expiratory wheezing, Olam PA notified
